# Patient Record
Sex: Female | Born: 1945
Health system: Southern US, Community
[De-identification: ages and names within clinical notes are randomized; demographics above are authoritative.]

## PROBLEM LIST (undated history)

## (undated) DIAGNOSIS — Z87440 Personal history of urinary (tract) infections: Secondary | ICD-10-CM

## (undated) DIAGNOSIS — J0510 Acute epiglottitis without obstruction: Secondary | ICD-10-CM

## (undated) DIAGNOSIS — F419 Anxiety disorder, unspecified: Secondary | ICD-10-CM

## (undated) DIAGNOSIS — J0430 Supraglottitis, unspecified, without obstruction: Secondary | ICD-10-CM

## (undated) DIAGNOSIS — N200 Calculus of kidney: Secondary | ICD-10-CM

## (undated) DIAGNOSIS — T7840XA Allergy, unspecified, initial encounter: Secondary | ICD-10-CM

## (undated) DIAGNOSIS — J181 Lobar pneumonia, unspecified organism: Secondary | ICD-10-CM

## (undated) DIAGNOSIS — F410 Panic disorder [episodic paroxysmal anxiety] without agoraphobia: Secondary | ICD-10-CM

## (undated) HISTORY — DX: Allergy, unspecified, initial encounter: T78.40XA

## (undated) HISTORY — DX: Anxiety disorder, unspecified: F41.9

## (undated) HISTORY — DX: Supraglottitis, unspecified, without obstruction: J04.30

## (undated) HISTORY — DX: Acute epiglottitis without obstruction: J05.10

## (undated) HISTORY — DX: Panic disorder (episodic paroxysmal anxiety): F41.0

## (undated) HISTORY — DX: Lobar pneumonia, unspecified organism: J18.1

## (undated) HISTORY — DX: Personal history of urinary (tract) infections: Z87.440

## (undated) HISTORY — PX: NO PAST SURGERIES: SHX2092

---

## 1986-12-08 HISTORY — PX: TUBAL LIGATION: SHX77

## 1998-05-02 ENCOUNTER — Emergency Department (HOSPITAL_COMMUNITY): Admission: EM | Admit: 1998-05-02 | Discharge: 1998-05-02 | Payer: Self-pay | Admitting: Emergency Medicine

## 1999-12-10 ENCOUNTER — Encounter: Payer: Self-pay | Admitting: Family Medicine

## 1999-12-10 ENCOUNTER — Encounter: Admission: RE | Admit: 1999-12-10 | Discharge: 1999-12-10 | Payer: Self-pay | Admitting: Family Medicine

## 2001-06-14 ENCOUNTER — Encounter: Admission: RE | Admit: 2001-06-14 | Discharge: 2001-09-12 | Payer: Self-pay | Admitting: Family Medicine

## 2001-06-29 ENCOUNTER — Encounter: Admission: RE | Admit: 2001-06-29 | Discharge: 2001-06-29 | Payer: Self-pay | Admitting: Family Medicine

## 2001-06-29 ENCOUNTER — Encounter: Payer: Self-pay | Admitting: Family Medicine

## 2003-06-29 ENCOUNTER — Encounter: Admission: RE | Admit: 2003-06-29 | Discharge: 2003-06-29 | Payer: Self-pay | Admitting: Family Medicine

## 2003-06-29 ENCOUNTER — Encounter: Payer: Self-pay | Admitting: Family Medicine

## 2003-12-04 ENCOUNTER — Ambulatory Visit (HOSPITAL_COMMUNITY): Admission: RE | Admit: 2003-12-04 | Discharge: 2003-12-04 | Payer: Self-pay | Admitting: Gastroenterology

## 2004-07-01 ENCOUNTER — Ambulatory Visit (HOSPITAL_COMMUNITY): Admission: RE | Admit: 2004-07-01 | Discharge: 2004-07-01 | Payer: Self-pay | Admitting: Obstetrics and Gynecology

## 2004-08-07 ENCOUNTER — Emergency Department (HOSPITAL_COMMUNITY): Admission: EM | Admit: 2004-08-07 | Discharge: 2004-08-07 | Payer: Self-pay | Admitting: Emergency Medicine

## 2004-12-27 ENCOUNTER — Ambulatory Visit: Payer: Self-pay | Admitting: Internal Medicine

## 2005-02-28 ENCOUNTER — Ambulatory Visit: Payer: Self-pay | Admitting: Internal Medicine

## 2005-03-07 ENCOUNTER — Ambulatory Visit: Payer: Self-pay | Admitting: Internal Medicine

## 2005-07-09 ENCOUNTER — Ambulatory Visit (HOSPITAL_COMMUNITY): Admission: RE | Admit: 2005-07-09 | Discharge: 2005-07-09 | Payer: Self-pay | Admitting: Internal Medicine

## 2006-07-16 ENCOUNTER — Ambulatory Visit (HOSPITAL_COMMUNITY): Admission: RE | Admit: 2006-07-16 | Discharge: 2006-07-16 | Payer: Self-pay | Admitting: Internal Medicine

## 2007-07-21 ENCOUNTER — Encounter: Admission: RE | Admit: 2007-07-21 | Discharge: 2007-07-21 | Payer: Self-pay | Admitting: Internal Medicine

## 2008-06-27 ENCOUNTER — Other Ambulatory Visit: Admission: RE | Admit: 2008-06-27 | Discharge: 2008-06-27 | Payer: Self-pay | Admitting: Obstetrics and Gynecology

## 2008-07-21 ENCOUNTER — Encounter: Admission: RE | Admit: 2008-07-21 | Discharge: 2008-07-21 | Payer: Self-pay | Admitting: Internal Medicine

## 2009-07-24 ENCOUNTER — Encounter: Admission: RE | Admit: 2009-07-24 | Discharge: 2009-07-24 | Payer: Self-pay | Admitting: Obstetrics and Gynecology

## 2009-07-26 ENCOUNTER — Encounter: Admission: RE | Admit: 2009-07-26 | Discharge: 2009-07-26 | Payer: Self-pay | Admitting: Obstetrics and Gynecology

## 2010-01-31 ENCOUNTER — Encounter: Admission: RE | Admit: 2010-01-31 | Discharge: 2010-01-31 | Payer: Self-pay | Admitting: Obstetrics and Gynecology

## 2010-07-30 ENCOUNTER — Encounter: Admission: RE | Admit: 2010-07-30 | Discharge: 2010-07-30 | Payer: Self-pay | Admitting: Internal Medicine

## 2010-09-25 ENCOUNTER — Encounter: Payer: Self-pay | Admitting: Internal Medicine

## 2010-11-25 ENCOUNTER — Encounter
Admission: RE | Admit: 2010-11-25 | Discharge: 2010-11-25 | Payer: Self-pay | Source: Home / Self Care | Attending: Obstetrics and Gynecology | Admitting: Obstetrics and Gynecology

## 2010-11-25 ENCOUNTER — Encounter: Payer: Self-pay | Admitting: Internal Medicine

## 2010-12-28 ENCOUNTER — Encounter: Payer: Self-pay | Admitting: Urology

## 2010-12-29 ENCOUNTER — Encounter: Payer: Self-pay | Admitting: Internal Medicine

## 2011-01-07 NOTE — Letter (Signed)
Summary: [facility]  [facility]   Imported By: Maryln Gottron 10/08/2010 10:13:36  _____________________________________________________________________  External Attachment:    Type:   Image     Comment:   External Document

## 2011-04-25 NOTE — Op Note (Signed)
NAME:  Heather Lamb, Heather Lamb                         ACCOUNT NO.:  [id number]   MEDICAL RECORD NO.:  [id number]                   PATIENT TYPE:  AMB   LOCATION:  ENDO                                 FACILITY:  [facility]   PHYSICIAN:  Petra Kuba, M.D.                 DATE OF BIRTH:  10/16/1946   DATE OF PROCEDURE:  12/04/2003  DATE OF DISCHARGE:                                 OPERATIVE REPORT   PROCEDURE:  Colonoscopy.   INDICATIONS:  Screening.  Consent was signed after risks, benefits, methods,  and options were thoroughly discussed in the office.   PREMEDICATIONS:  Demerol 70 mg, Versed 7 mg.   DESCRIPTION OF PROCEDURE:  Rectal inspection pertinent for external  hemorrhoids.  Digital exam was negative.  Video pediatric adjustable  colonoscope was inserted and fairly easily advanced around the colon to the  cecum.  No abnormalities seen on insertion.  No position changes or  abdominal pressure was needed.  The cecum was identified by the appendiceal  orifice and the ileocecal valve.  The scope was inserted a short ways into  the terminal ileum which was normal.  Further documentation was obtained.  The scope was slowly withdrawn.  Prep was adequate.  There was some liquid  stool that required washing and suctioning.  On slow withdrawal through the  colon, no polyps, tumors, masses, or diverticula were seen.  As we slowly  withdrew back to the rectum,  anorectal pull-through and retroflexion  confirmed some small hemorrhoids.  The scope was reinserted a short ways up  the left side of the colon.  Air was suctioned and the scope removed.  The  patient tolerated the procedure well.  There were no obvious immediate  complications.   ENDOSCOPIC DIAGNOSES:  1. Internal and external hemorrhoids.  2. Normal exam to the cecum.  Quick look into the terminal ileum.   PLAN:   PLAN:  Yearly rectals and guaiacs per Dr. Kevan Ny.  Would be happy to see him  back p.r.n.  Otherwise repeat screening in  5-10 years.                                               Petra Kuba, M.D.    MEM/MEDQ  D:  12/04/2003  T:  12/04/2003  Job:  [id number]   cc:   Duncan Dull, M.D.  [address]  [place]  [place] [address]  Fax: [phone]

## 2011-04-25 NOTE — Procedures (Signed)
NAME:  Heather Lamb, Heather Lamb NO.:  [id number]   MEDICAL RECORD NO.:  [id number]                   PATIENT TYPE:  EMS   LOCATION:  ED                                   FACILITY:  [facility]   PHYSICIAN:  Edward L. Juanetta Gosling, M.D.             DATE OF BIRTH:  01-20-1946   DATE OF PROCEDURE:  DATE OF DISCHARGE:  08/07/2004                                EKG INTERPRETATION   IMPRESSION:  The rhythm is a supraventricular tachycardia with a rate in the  220's.  The axis is rightward.  ST-T wave abnormalities are seen inferiorly  which could indicate ischemia, and clinical correlation is suggestion.  Abnormal electrocardiogram.      ___________________________________________                                            Oneal Deputy. Juanetta Gosling, M.D.   ELH/MEDQ  D:  08/07/2004  T:  08/07/2004  Job:  [id number]

## 2011-08-12 ENCOUNTER — Other Ambulatory Visit: Payer: Self-pay | Admitting: Obstetrics and Gynecology

## 2011-08-12 DIAGNOSIS — Z1231 Encounter for screening mammogram for malignant neoplasm of breast: Secondary | ICD-10-CM

## 2011-09-24 ENCOUNTER — Ambulatory Visit
Admission: RE | Admit: 2011-09-24 | Discharge: 2011-09-24 | Disposition: A | Discharge status: Home / Self Care | Payer: BC Managed Care – PPO | Source: Ambulatory Visit | Attending: Obstetrics and Gynecology | Admitting: Obstetrics and Gynecology

## 2011-09-24 DIAGNOSIS — Z1231 Encounter for screening mammogram for malignant neoplasm of breast: Secondary | ICD-10-CM

## 2011-10-10 ENCOUNTER — Ambulatory Visit (INDEPENDENT_AMBULATORY_CARE_PROVIDER_SITE_OTHER)
Admission: RE | Admit: 2011-10-10 | Discharge: 2011-10-10 | Disposition: A | Discharge status: Home / Self Care | Payer: BC Managed Care – PPO | Source: Ambulatory Visit | Attending: Internal Medicine | Admitting: Internal Medicine

## 2011-10-10 ENCOUNTER — Ambulatory Visit (INDEPENDENT_AMBULATORY_CARE_PROVIDER_SITE_OTHER): Payer: BC Managed Care – PPO | Admitting: Internal Medicine

## 2011-10-10 ENCOUNTER — Encounter: Payer: Self-pay | Admitting: Internal Medicine

## 2011-10-10 VITALS — BP 120/80 | HR 126 | Temp 99.1°F | Ht 68.0 in | Wt 186.0 lb

## 2011-10-10 DIAGNOSIS — R0989 Other specified symptoms and signs involving the circulatory and respiratory systems: Secondary | ICD-10-CM

## 2011-10-10 DIAGNOSIS — R0600 Dyspnea, unspecified: Secondary | ICD-10-CM | POA: Insufficient documentation

## 2011-10-10 DIAGNOSIS — R509 Fever, unspecified: Secondary | ICD-10-CM | POA: Insufficient documentation

## 2011-10-10 DIAGNOSIS — R0609 Other forms of dyspnea: Secondary | ICD-10-CM

## 2011-10-10 DIAGNOSIS — J189 Pneumonia, unspecified organism: Secondary | ICD-10-CM

## 2011-10-10 LAB — HEPATIC FUNCTION PANEL
ALT: 20 U/L (ref 0–35)
Albumin: 4.2 g/dL (ref 3.5–5.2)
Alkaline Phosphatase: 93 U/L (ref 39–117)
Total Bilirubin: 0.9 mg/dL (ref 0.3–1.2)

## 2011-10-10 LAB — BASIC METABOLIC PANEL
CO2: 26 mEq/L (ref 19–32)
Calcium: 9 mg/dL (ref 8.4–10.5)
Creatinine, Ser: 0.8 mg/dL (ref 0.4–1.2)
GFR: 78.15 mL/min (ref >60.00)
Glucose, Bld: 104 mg/dL — ABNORMAL HIGH (ref 70–99)
Potassium: 4.2 mEq/L (ref 3.5–5.1)

## 2011-10-10 LAB — POCT URINALYSIS DIPSTICK
Blood, UA: NEGATIVE
Nitrite, UA: NEGATIVE
Urobilinogen, UA: 0.2

## 2011-10-10 LAB — CBC WITH DIFFERENTIAL/PLATELET
Eosinophils Relative: 0.1 % (ref 0.0–5.0)
MCHC: 33.8 g/dL (ref 30.0–36.0)
Monocytes Relative: 10.3 % (ref 3.0–12.0)
RBC: 5.49 Mil/uL — ABNORMAL HIGH (ref 3.87–5.11)
RDW: 14.2 % (ref 11.5–14.6)

## 2011-10-10 MED ORDER — LEVOFLOXACIN 750 MG PO TABS: 750.0000 mg | ORAL_TABLET | Freq: Every day | ORAL | Status: AC

## 2011-10-10 NOTE — Progress Notes (Signed)
Subjective:    Patient ID: Heather Lamb, female    DOB: 19-Feb-1946, 65 y.o.   MRN: [id number]  HPI  Patient comes in today for SDA  Acute urgent  Work in for acute problem evaluation with her husband .  Sudden onset of  Illness3 days. with fever cough and sever frontal headache.  The day before felling achy but had been very well She has not been in our practice since  2006 because she has been well and sees her gyne on a reg basis  And has had no major illness or injury.  .     Cough onset same time. bad headache and NOt better with  Hydrocodone ,aleve, advil : cough  Is dry  And someetimes deep and severe head  pain increase with cough; ears hurt.  No pleurisy cp but chest feels heavy.  "Legs are jello. " Worse today than yesterday  . Feels short of breath and very weak. Po fluids ok  but "not that much" . urine concentrated.Decrease appettite Never gets flu shot or flu.  No exposures although is outdoors a lot .  Sees GYNE regularly.  Review of Systems NO uti sx .  Rash bites bleeding  Was well before  Walked 4 miles as regular before then.  ROS:  GEN/ HEENTNos vision problems hearing changes, CV/ PULM; No , syncope,edema  change in exercise tolerance. Except as above GI /GU: No adominal pain, vomiting, change in bowel habits. No blood in the stool. No significant GU symptoms. SKIN/HEME: ,no acute skin rashes suspicious lesions or bleeding. No lymphadenopathy, nodules, masses.  NEURO/ PSYCH:  No neurologic signs such as weakness numbness No depression anxiety. IMM/ Allergy: No unusual infections.  Allergy .  No   REST of 12 system review negative or as per hpi   Past Medical History  Diagnosis Date  . History of UTI    Past Surgical History  Procedure Date  . No past surgeries     reports that she has never smoked. She does not have any smokeless tobacco history on file. She reports that she does not drink alcohol or use illicit drugs. family history includes Arthritis in her  father; Heart disease in her father; Hyperlipidemia in an unspecified family member; Hypertension in an unspecified family member; and Stroke in an unspecified family member. No Known Allergies     Objective:   Physical Exam  Physical Exam: Vital signs reviewed BJY:NWGN is a well-developed well-nourished ill appearing  cooperative  white female who appears her stated age  Ill  But non toxic  Quiet respirations HEENT: normocephalic  traumatic , Eyes: PERRL EOM's full, conjunctiva clear, Nares: paten,t no deformity discharge or tenderness., Ears: no deformity EAC's clear TMs with normal landmarks. Mouth: clear OP, no lesions, edema.  Moist mucous membranes. Dentition in adequate repair. Early fever blister lower lip NECK: supple without masses, thyromegaly or bruits. CHEST/PULM:  Clear to auscultation and percussion breath sounds equal no wheeze , rales or rhonchi. No chest wall deformities or tenderness. CV: PMI is nondisplaced, S1 S2 no gallops, murmurs, rubs. Peripheral pulses are full without delay. ABDOMEN: Bowel sounds normal nontender  No guard or rebound, no hepato splenomegal no CVA tenderness.  Extremtities:  No clubbing cyanosis or edema, no acute joint swelling or redness no focal atrophy NEURO:  Oriented x3, cranial nerves 3-12 appear to be intact, no obvious focal weakness,gait within normal limits no abnormal reflexes or asymmetrical SKIN: No acute rashes normal turgor, color,  no bruising or petechiae. Some dryness  No rash  PSYCH: Oriented, good eye contact, no obvious depression anxiety, cognition and judgment appear normal. Sick and wearing mask. Prefers to lay down     Assessment & Plan:  Acute febrile illness with cough  HA and SOB   No underlying comorbid  illness hx  R/o pneumonia   Flu etc.  Encourage hydration    Will call with labs   Cxray shows consolidated RUL  Needs close  fu. Will rx with Levaquin and close fu.  See message.  Lab Results  Component Value  Date   WBC 10.5 10/10/2011   HGB 15.6* 10/10/2011   HCT 46.0 10/10/2011   PLT 205.0 10/10/2011   GLUCOSE 104* 10/10/2011   ALT 20 10/10/2011   AST 15 10/10/2011   NA 138 10/10/2011   K 4.2 10/10/2011   CL 102 10/10/2011   CREATININE 0.8 10/10/2011   BUN 13 10/10/2011   CO2 26 10/10/2011

## 2011-10-10 NOTE — Progress Notes (Signed)
Pt's husband aware of results

## 2011-10-10 NOTE — Patient Instructions (Signed)
This acts like either flu  typeillness wichis viral  But concern about pneumonia so get chest x ray and will notify you of  Labs and plan

## 2011-10-11 DIAGNOSIS — J189 Pneumonia, unspecified organism: Secondary | ICD-10-CM | POA: Insufficient documentation

## 2011-10-11 HISTORY — DX: Pneumonia, unspecified organism: J18.9

## 2011-11-18 ENCOUNTER — Other Ambulatory Visit: Payer: Self-pay | Admitting: Internal Medicine

## 2011-11-21 ENCOUNTER — Ambulatory Visit (INDEPENDENT_AMBULATORY_CARE_PROVIDER_SITE_OTHER)
Admission: RE | Admit: 2011-11-21 | Discharge: 2011-11-21 | Disposition: A | Discharge status: Home / Self Care | Payer: BC Managed Care – PPO | Source: Ambulatory Visit | Attending: Internal Medicine | Admitting: Internal Medicine

## 2011-11-21 DIAGNOSIS — J189 Pneumonia, unspecified organism: Secondary | ICD-10-CM

## 2011-12-01 NOTE — Progress Notes (Signed)
Quick Note:  Pt aware of results. ______ 

## 2012-07-26 ENCOUNTER — Ambulatory Visit (INDEPENDENT_AMBULATORY_CARE_PROVIDER_SITE_OTHER): Payer: BC Managed Care – PPO | Admitting: Internal Medicine

## 2012-07-26 ENCOUNTER — Encounter: Payer: Self-pay | Admitting: Internal Medicine

## 2012-07-26 VITALS — BP 134/84 | Temp 98.7°F | Wt 190.0 lb

## 2012-07-26 DIAGNOSIS — R42 Dizziness and giddiness: Secondary | ICD-10-CM

## 2012-07-26 MED ORDER — MOMETASONE FUROATE 50 MCG/ACT NA SUSP: 2.0000 | Freq: Every day | NASAL | Status: DC

## 2012-07-26 MED ORDER — DIAZEPAM 2 MG PO TABS: 2.0000 mg | ORAL_TABLET | Freq: Three times a day (TID) | ORAL | Status: AC | PRN

## 2012-07-26 NOTE — Patient Instructions (Addendum)
Please call our office if your symptoms do not improve or gets worse. Follow up with Dr. Fabian Sharp within 2 weeks

## 2012-07-26 NOTE — Assessment & Plan Note (Signed)
66 year old white female with positional dizziness and left ear pressure. I suspect her symptoms may be secondary to labyrinthitis. Consider component of benign positional vertigo. Use Nasonex and diazepam as directed.  Patient advised to call office if symptoms persist or worsen.

## 2012-07-26 NOTE — Progress Notes (Signed)
  Subjective:    Patient ID: Heather Lamb, female    DOB: [birth year], 66 y.o.   MRN: [id number]  HPI  66 year old white female complains of left earache and dizziness for 4 days. Patient reports over the last several days that she feels unsteady especially after sudden movements. She denies hearing loss. She started over-the-counter Sudafed and notes some improvement. Patient also reports increasing postnasal drip after starting Sudafed.  Review of Systems  Negative for headache,  Negative for fever or chills  Past Medical History  Diagnosis Date  . History of UTI     History   Social History  . Marital Status: Married    Spouse Name: N/A    Number of Children: N/A  . Years of Education: N/A   Occupational History  . Not on file.   Social History Main Topics  . Smoking status: Never Smoker   . Smokeless tobacco: Not on file  . Alcohol Use: No  . Drug Use: No  . Sexually Active: Not on file   Other Topics Concern  . 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     Past Surgical History  Procedure Date  . No past surgeries     Family History  Problem Relation Age of Onset  . Arthritis Father   . Heart disease Father   . Hypertension      parent  . Stroke    . Hyperlipidemia      No Known Allergies  Current Outpatient Prescriptions on File Prior to Visit  Medication Sig Dispense Refill  . calcium carbonate 1250 MG capsule Take 1,250 mg by mouth 2 (two) times daily with a meal.        . cetirizine (ZYRTEC) 10 MG tablet Take 10 mg by mouth daily.      . cholecalciferol (VITAMIN D) 1000 UNITS tablet Take 1,000 Units by mouth daily.          BP 134/84  Temp 98.7 F (37.1 C) (Oral)  Wt 190 lb (86.183 kg)       Objective:   Physical Exam  Constitutional: She is oriented to person, place, and time. She appears well-developed and well-nourished. No distress.  HENT:  Head:  Normocephalic and atraumatic.       Left tympanic membrane slightly retracted Hearing is grossly normal  Eyes: EOM are normal.       No nystagmus  Neck: Neck supple.       No neck tenderness  Cardiovascular: Normal rate, regular rhythm and normal heart sounds.   No murmur heard. Pulmonary/Chest: Effort normal and breath sounds normal. She has no wheezes.  Lymphadenopathy:    She has no cervical adenopathy.  Neurological: She is alert and oriented to person, place, and time. No cranial nerve deficit.          Assessment & Plan:

## 2012-08-11 ENCOUNTER — Ambulatory Visit (INDEPENDENT_AMBULATORY_CARE_PROVIDER_SITE_OTHER): Payer: BC Managed Care – PPO | Admitting: Internal Medicine

## 2012-08-11 ENCOUNTER — Encounter: Payer: Self-pay | Admitting: Internal Medicine

## 2012-08-11 VITALS — BP 160/94 | HR 86 | Temp 97.8°F | Wt 194.0 lb

## 2012-08-11 DIAGNOSIS — Z8669 Personal history of other diseases of the nervous system and sense organs: Secondary | ICD-10-CM

## 2012-08-11 DIAGNOSIS — Z87898 Personal history of other specified conditions: Secondary | ICD-10-CM

## 2012-08-11 DIAGNOSIS — H9312 Tinnitus, left ear: Secondary | ICD-10-CM

## 2012-08-11 DIAGNOSIS — R030 Elevated blood-pressure reading, without diagnosis of hypertension: Secondary | ICD-10-CM

## 2012-08-11 DIAGNOSIS — H6990 Unspecified Eustachian tube disorder, unspecified ear: Secondary | ICD-10-CM | POA: Insufficient documentation

## 2012-08-11 DIAGNOSIS — H9319 Tinnitus, unspecified ear: Secondary | ICD-10-CM

## 2012-08-11 DIAGNOSIS — H6980 Other specified disorders of Eustachian tube, unspecified ear: Secondary | ICD-10-CM

## 2012-08-11 DIAGNOSIS — J329 Chronic sinusitis, unspecified: Secondary | ICD-10-CM

## 2012-08-11 MED ORDER — PREDNISONE 20 MG PO TABS: ORAL_TABLET | ORAL | Status: AC

## 2012-08-11 MED ORDER — AMOXICILLIN-POT CLAVULANATE 875-125 MG PO TABS: 1.0000 | ORAL_TABLET | Freq: Two times a day (BID) | ORAL | Status: AC

## 2012-08-11 NOTE — Progress Notes (Signed)
  Subjective:    Patient ID: Heather Lamb, female    DOB: 01-08-1946, 66 y.o.   MRN: [id number]  HPI Patient comes in for followup having been seen a couple weeks ago for acute vertigo felt from labyrinthitis. She had some symptoms in her left ear. Since that time she took Valium and the Nasonex and it helped greatly. She is now only taking the Nasonex. nasonex and diazepam if needed and then dizziness got better .   No diazepam he still has ringing feeling in her ears in the morning that goes away after she gets up in the drainage gets better Ringing       Left.   Ear  Ringing  Every am.   Has congestion  And blows nose and after ir drains feels better. She feels like she has pressure in her face more on the left. No nausea vomiting or falling or dizziness.  Headaches  it had some on the left side where there was a knot at the back of her neck that is now better   Tends to get some sinus problems but no diagnosed allergy. Watery eyes.   When reading.    He tends to have some congestion in the seasons. Review of Systems Negative for fever chest pain shortness of breath blood pressure is usually normal although can raise up when she is at the doctor's office. No ha vision change SOB   Past history family history social history reviewed in the electronic medical record.     Objective:   Physical Exam BP 160/94  Pulse 86  Temp 97.8 F (36.6 C) (Oral)  Wt 194 lb (87.998 kg)  SpO2 97% Repeat BP 150/88 right  WDWN in nad congested . eues clear  eoms nl ears tmx intact nl lm  hearing appears good on the right appears to be decreased on the left to finger rub. Nares mucoid dc left face mildly tender on left .   `OP clear without lesion tongue midline Neck supple palpable a.c. nodes nontender mobile  Chest clear to auscultation cardiac S1-S2 regular rhythm. Negative CCE Neurologic cranial nerves III through XII except for hearing on the left appear to be intact. No focal weakness gait within  normal limits heel-to-shin normal negative Romberg and no tremor     Assessment & Plan:   Vertigo improved Continued ongoing sinus congestion with left-sided symptoms with a.m. ringing in the ear and left-sided symptoms.  Suspect a sinusitis  with eustachian tube dysfunction that is trying to resolve; because she has persistent symptoms and is congested we'll add antibiotic some 5 days of prednisone have her continue her Nasonex that I think has helped her a good bit and if does not get back to baseline with this treatment consider further workup ENTt consult versus sinus imaging.  Elevated blood pressure reading  coming down probably white coat effect she should check her readings at home have machine. Contact us if elevated. For further evaluation

## 2012-08-11 NOTE — Patient Instructions (Signed)
Your exam is normal except for your sinus congestion. We are going to treat you for sinusitis that has not resolved with prednisone 5 days and antibiotics.. Continue the Nasonex even after you are better to suppress chronic congestion. I suspect that you have eustachian tube dysfunction on the left that is causing some of your symptoms.  neurologic exam is good.  If not getting back to normal after the above treatments and a week or 2 contact us would consider having ENT. evaluate your situation.  Check blood pressure readings at home it is elevated today at the office below 140/90 is the goal.  If continued elevated contact us.

## 2012-08-31 ENCOUNTER — Other Ambulatory Visit: Payer: Self-pay | Admitting: Obstetrics and Gynecology

## 2012-08-31 DIAGNOSIS — Z1231 Encounter for screening mammogram for malignant neoplasm of breast: Secondary | ICD-10-CM

## 2012-10-06 ENCOUNTER — Ambulatory Visit
Admission: RE | Admit: 2012-10-06 | Discharge: 2012-10-06 | Disposition: A | Discharge status: Home / Self Care | Payer: BC Managed Care – PPO | Source: Ambulatory Visit | Attending: Obstetrics and Gynecology | Admitting: Obstetrics and Gynecology

## 2012-10-06 ENCOUNTER — Ambulatory Visit (INDEPENDENT_AMBULATORY_CARE_PROVIDER_SITE_OTHER): Payer: BC Managed Care – PPO | Admitting: Family Medicine

## 2012-10-06 DIAGNOSIS — Z23 Encounter for immunization: Secondary | ICD-10-CM

## 2012-10-06 DIAGNOSIS — Z1231 Encounter for screening mammogram for malignant neoplasm of breast: Secondary | ICD-10-CM

## 2012-10-06 DIAGNOSIS — Z2911 Encounter for prophylactic immunotherapy for respiratory syncytial virus (RSV): Secondary | ICD-10-CM

## 2012-11-27 ENCOUNTER — Encounter: Payer: Self-pay | Admitting: Family Medicine

## 2012-11-27 ENCOUNTER — Ambulatory Visit (INDEPENDENT_AMBULATORY_CARE_PROVIDER_SITE_OTHER): Payer: BC Managed Care – PPO | Admitting: Family Medicine

## 2012-11-27 VITALS — BP 118/82 | HR 89 | Temp 98.2°F | Ht 68.0 in | Wt 191.0 lb

## 2012-11-27 DIAGNOSIS — J329 Chronic sinusitis, unspecified: Secondary | ICD-10-CM

## 2012-11-27 DIAGNOSIS — R030 Elevated blood-pressure reading, without diagnosis of hypertension: Secondary | ICD-10-CM

## 2012-11-27 MED ORDER — GUAIFENESIN ER 600 MG PO TB12: 600.0000 mg | ORAL_TABLET | Freq: Two times a day (BID) | ORAL | Status: DC

## 2012-11-27 MED ORDER — AMOXICILLIN-POT CLAVULANATE 875-125 MG PO TABS: 1.0000 | ORAL_TABLET | Freq: Two times a day (BID) | ORAL | Status: DC

## 2012-11-27 MED ORDER — HYDROCOD POLST-CHLORPHEN POLST 10-8 MG/5ML PO LQCR: 5.0000 mL | Freq: Every evening | ORAL | Status: DC | PRN

## 2012-11-27 NOTE — Assessment & Plan Note (Signed)
Augmentin, Mucinex, increase rest and fluid, cough is keeping her up so given Tussinex prn

## 2012-11-27 NOTE — Patient Instructions (Addendum)
Probiotics daily, Digestive Advantage, Align, Phillip's colon while on antibiotics  Sinusitis Sinusitis is redness, soreness, and swelling (inflammation) of the paranasal sinuses. Paranasal sinuses are air pockets within the bones of your face (beneath the eyes, the middle of the forehead, or above the eyes). In healthy paranasal sinuses, mucus is able to drain out, and air is able to circulate through them by way of your nose. However, when your paranasal sinuses are inflamed, mucus and air can become trapped. This can allow bacteria and other germs to grow and cause infection. Sinusitis can develop quickly and last only a short time (acute) or continue over a long period (chronic). Sinusitis that lasts for more than 12 weeks is considered chronic.  CAUSES  Causes of sinusitis include:  Allergies.  Structural abnormalities, such as displacement of the cartilage that separates your nostrils (deviated septum), which can decrease the air flow through your nose and sinuses and affect sinus drainage.  Functional abnormalities, such as when the small hairs (cilia) that line your sinuses and help remove mucus do not work properly or are not present. SYMPTOMS  Symptoms of acute and chronic sinusitis are the same. The primary symptoms are pain and pressure around the affected sinuses. Other symptoms include:  Upper toothache.  Earache.  Headache.  Bad breath.  Decreased sense of smell and taste.  A cough, which worsens when you are lying flat.  Fatigue.  Fever.  Thick drainage from your nose, which often is green and may contain pus (purulent).  Swelling and warmth over the affected sinuses. DIAGNOSIS  Your caregiver will perform a physical exam. During the exam, your caregiver may:  Look in your nose for signs of abnormal growths in your nostrils (nasal polyps).  Tap over the affected sinus to check for signs of infection.  View the inside of your sinuses (endoscopy) with a special  imaging device with a light attached (endoscope), which is inserted into your sinuses. If your caregiver suspects that you have chronic sinusitis, one or more of the following tests may be recommended:  Allergy tests.  Nasal culture A sample of mucus is taken from your nose and sent to a lab and screened for bacteria.  Nasal cytology A sample of mucus is taken from your nose and examined by your caregiver to determine if your sinusitis is related to an allergy. TREATMENT  Most cases of acute sinusitis are related to a viral infection and will resolve on their own within 10 days. Sometimes medicines are prescribed to help relieve symptoms (pain medicine, decongestants, nasal steroid sprays, or saline sprays).  However, for sinusitis related to a bacterial infection, your caregiver will prescribe antibiotic medicines. These are medicines that will help kill the bacteria causing the infection.  Rarely, sinusitis is caused by a fungal infection. In theses cases, your caregiver will prescribe antifungal medicine. For some cases of chronic sinusitis, surgery is needed. Generally, these are cases in which sinusitis recurs more than 3 times per year, despite other treatments. HOME CARE INSTRUCTIONS   Drink plenty of water. Water helps thin the mucus so your sinuses can drain more easily.  Use a humidifier.  Inhale steam 3 to 4 times a day (for example, sit in the bathroom with the shower running).  Apply a warm, moist washcloth to your face 3 to 4 times a day, or as directed by your caregiver.  Use saline nasal sprays to help moisten and clean your sinuses.  Take over-the-counter or prescription medicines for pain, discomfort,  or fever only as directed by your caregiver. SEEK IMMEDIATE MEDICAL CARE IF:  You have increasing pain or severe headaches.  You have nausea, vomiting, or drowsiness.  You have swelling around your face.  You have vision problems.  You have a stiff neck.  You have  difficulty breathing. MAKE SURE YOU:   Understand these instructions.  Will watch your condition.  Will get help right away if you are not doing well or get worse. Document Released: 11/24/2005 Document Revised: 02/16/2012 Document Reviewed: 12/09/2011 [facility] Patient Information 2013 [place], [place].

## 2012-11-27 NOTE — Assessment & Plan Note (Signed)
Well controlled despite acute illness 

## 2012-11-27 NOTE — Progress Notes (Signed)
Patient ID: Heather Lamb, female   DOB: 1946-08-09, 66 y.o.   MRN: [id number] Heather Lamb [id number] [birth year] 11/27/2012      Progress Note-Follow Up  Subjective  Chief Complaint  Chief Complaint  Patient presents with  . Otalgia    sinus drainage, fever cough x 2days    HPI  Patient is a 66 year old female who is in today complaining of respiratory symptoms. Symptoms for roughly 4 days now. Complaining of headache, fevers, chills , postnasal drip and nasal congestion. No significant rhinorrhea or myalgias. Is struggling with fatigue and her chest congestion is noting she is most worried about. At this point she has trouble sleeping when she lies down the cough is worse. She tried Tylenol for temperature which does bring it down but then it recurrs  Past Medical History  Diagnosis Date  . History of UTI     Past Surgical History  Procedure Date  . No past surgeries     Family History  Problem Relation Age of Onset  . Arthritis Father   . Heart disease Father   . Hypertension      parent  . Stroke    . Hyperlipidemia      History   Social History  . Marital Status: Married    Spouse Name: N/A    Number of Children: N/A  . Years of Education: N/A   Occupational History  . Not on file.   Social History Main Topics  . Smoking status: Never Smoker   . Smokeless tobacco: Not on file  . Alcohol Use: No  . Drug Use: No  . Sexually Active: Not on file   Other Topics Concern  . 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     Current Outpatient Prescriptions on File Prior to Visit  Medication Sig Dispense Refill  . calcium carbonate 1250 MG capsule Take 1,250 mg by mouth 2 (two) times daily with a meal.        . cetirizine (ZYRTEC) 10 MG tablet Take 10 mg by mouth daily.      . cholecalciferol (VITAMIN D) 1000 UNITS tablet Take 1,000 Units by mouth daily.        . mometasone  (NASONEX) 50 MCG/ACT nasal spray Place 2 sprays into the nose daily.  17 g  2    No Known Allergies  Review of Systems  Review of Systems  Constitutional: Positive for fever and malaise/fatigue.  HENT: Positive for ear pain, congestion and sore throat.   Eyes: Negative for discharge.  Respiratory: Positive for cough and sputum production. Negative for shortness of breath.   Cardiovascular: Negative for chest pain, palpitations and leg swelling.  Gastrointestinal: Negative for nausea, abdominal pain and diarrhea.  Genitourinary: Negative for dysuria.  Musculoskeletal: Negative for falls.  Skin: Negative for rash.  Neurological: Positive for headaches. Negative for loss of consciousness.  Endo/Heme/Allergies: Negative for polydipsia.  Psychiatric/Behavioral: Negative for depression and suicidal ideas. The patient is not nervous/anxious and does not have insomnia.     Objective  BP 118/82  Pulse 89  Temp 98.2 F (36.8 C) (Oral)  Ht 5\' 8"  (1.727 m)  Wt 191 lb (86.637 kg)  BMI 29.04 kg/m2  SpO2 95%  Physical Exam  Physical Exam  Constitutional: She is oriented to person, place, and time and well-developed, well-nourished, and in no distress. No distress.  HENT:  Head: Normocephalic and atraumatic.       tms dull and retracted, nasal mucosa boggy and erythematous  Eyes: Conjunctivae normal are normal.  Neck: Neck supple. No thyromegaly present.  Cardiovascular: Normal rate, regular rhythm and normal heart sounds.   No murmur heard. Pulmonary/Chest: Effort normal and breath sounds normal. She has no wheezes.  Abdominal: She exhibits no distension and no mass.  Musculoskeletal: She exhibits no edema.  Lymphadenopathy:    She has no cervical adenopathy.  Neurological: She is alert and oriented to person, place, and time.  Skin: Skin is warm and dry. No rash noted. She is not diaphoretic.  Psychiatric: Memory, affect and judgment normal.    No results found for this  basename: TSH   Lab Results  Component Value Date   WBC 10.5 10/10/2011   HGB 15.6* 10/10/2011   HCT 46.0 10/10/2011   MCV 83.9 10/10/2011   PLT 205.0 10/10/2011   Lab Results  Component Value Date   CREATININE 0.8 10/10/2011   BUN 13 10/10/2011   NA 138 10/10/2011   K 4.2 10/10/2011   CL 102 10/10/2011   CO2 26 10/10/2011   Lab Results  Component Value Date   ALT 20 10/10/2011   AST 15 10/10/2011   ALKPHOS 93 10/10/2011   BILITOT 0.9 10/10/2011     Assessment & Plan  Sinusitis Augmentin, Mucinex, increase rest and fluid, cough is keeping her up so given Tussinex prn  Elevated blood pressure reading Well controlled despite acute illness

## 2012-11-29 ENCOUNTER — Telehealth: Payer: Self-pay | Admitting: Internal Medicine

## 2012-11-29 NOTE — Telephone Encounter (Signed)
[facility] Triage Call Report Triage Record Num: [id number] Operator: Chevis Pretty Patient Name: Heather Lamb Call Date & Time: 11/27/2012 6:14:05AM Patient Phone: [phone] PCP: Neta Mends. Panosh Patient Gender: Female PCP Fax : [phone] Patient DOB: 05-Jan-1946 [facility] Name[facility]  Reason for Call: Caller: Raivyn/Patient; PCP: Berniece Andreas (Family Practice); CB#: [phone]; Call regarding Cough/Congestion; onset 11/24/12, but c/o onset of earache 11/27/12 bilaterally. Temp 100.5 O. Per URI protocol, emergent symptoms denied; advised appt within 24 hours. Appt scheduled 11/27/12 1115 with Dr. Abner Greenspan at [place] office.  Protocol(s) Used: Upper Respiratory Infection (URI) Recommended Outcome per Protocol: See Provider within 24 hours Reason for Outcome: Mild to moderate headache for more than 24 hours unrelieved with nonprescription medications

## 2012-12-07 ENCOUNTER — Telehealth: Payer: Self-pay | Admitting: Internal Medicine

## 2012-12-07 NOTE — Telephone Encounter (Signed)
Pt will call after New Year to sched appt if no better. Encounter closed.

## 2012-12-07 NOTE — Telephone Encounter (Signed)
Patient Information:  Caller Name: Louellen  Phone: [phone]  Patient: Heather Lamb, Heather Lamb  Gender: Female  DOB: 10-Oct-1946  Age: 66 Years  PCP: Berniece Andreas (Family Practice)  Office Follow Up:  Does the office need to follow up with this patient?: No  Instructions For The Office: N/A  RN Note:  Completed Augmentin for sinus infection and used up Tussinex.  Drinking lots of water, hot tea and honey.  Asking what else can be done for cough. Sinus headaches and fevers resolved. Intermittent dry cough, worse at night.  Symptoms  Reason For Call & Symptoms: Lingering intermittent cough  Reviewed Health History In EMR: Yes  Reviewed Medications In EMR: Yes  Reviewed Allergies In EMR: Yes  Reviewed Surgeries / Procedures: Yes  Date of Onset of Symptoms: 11/27/2012  Treatments Tried: mucinex, hot fluids, water, cough drops, elevated head of bed  Treatments Tried Worked: No  Guideline(s) Used:  Cough  Disposition Per Guideline:   See Within 3 Days in Office  Reason For Disposition Reached:   Cough has been present for > 10 days  Advice Given:  Reassurance  Coughing is the way that our lungs remove irritants and mucus. It helps protect our lungs from getting pneumonia.  You can get a dry hacking cough after a chest cold. Sometimes this type of cough can last 1-3 weeks, and be worse at night.  Cough Medicines:  OTC Cough Syrups: The most common cough suppressant in OTC cough medications is dextromethorphan. Often the letters "DM" appear in the name.  Home Remedy - Hard Candy: Hard candy works just as well as medicine-flavored OTC cough drops. Diabetics should use sugar-free candy.  Home Remedy - Honey: This old home remedy has been shown to help decrease coughing at night. The adult dosage is 2 teaspoons (10 ml) at bedtime. Honey should not be given to infants under one year of age.  Coughing Spasms:  Drink warm fluids. Inhale warm mist (Reason: both relax the airway and loosen  up the phlegm).  Prevent Dehydration:  Drink adequate liquids.  Expected Course:   The expected course depends on what is causing the cough.  Viral bronchitis (chest cold) causes a cough that lasts 1 to 3 weeks. Sometimes you may cough up lots of phlegm (sputum, mucus). The mucus can normally be white, gray, yellow, or green.  RN Overrode Recommendation:  Follow Up With Office Later  Will try care recommendations; prefers to schedule appointment with office after holiday, if cough persists

## 2013-03-28 ENCOUNTER — Ambulatory Visit: Payer: Self-pay | Admitting: Nurse Practitioner

## 2013-06-06 ENCOUNTER — Encounter: Payer: Self-pay | Admitting: Internal Medicine

## 2013-06-06 ENCOUNTER — Ambulatory Visit (INDEPENDENT_AMBULATORY_CARE_PROVIDER_SITE_OTHER): Payer: BC Managed Care – PPO | Admitting: Internal Medicine

## 2013-06-06 VITALS — BP 144/90 | HR 79 | Temp 98.0°F | Wt 193.0 lb

## 2013-06-06 DIAGNOSIS — R590 Localized enlarged lymph nodes: Secondary | ICD-10-CM

## 2013-06-06 DIAGNOSIS — H9202 Otalgia, left ear: Secondary | ICD-10-CM

## 2013-06-06 DIAGNOSIS — R599 Enlarged lymph nodes, unspecified: Secondary | ICD-10-CM

## 2013-06-06 DIAGNOSIS — J329 Chronic sinusitis, unspecified: Secondary | ICD-10-CM

## 2013-06-06 DIAGNOSIS — H9209 Otalgia, unspecified ear: Secondary | ICD-10-CM

## 2013-06-06 MED ORDER — AMOXICILLIN-POT CLAVULANATE 875-125 MG PO TABS: 1.0000 | ORAL_TABLET | Freq: Two times a day (BID) | ORAL | Status: DC

## 2013-06-06 NOTE — Patient Instructions (Signed)
This acts like a secondary infection    Poss bacterial sinusitis  infection . Your ear drum is normal but I think the pain is radiating from the large swollen gland in the left neck that  Could be from bacterial sinus or throat infection .

## 2013-06-06 NOTE — Progress Notes (Signed)
Chief Complaint  Patient presents with  . Fever    Has tried Multi Sx Mucinex.  Does not help.  . Chills  . Generalized Body Aches  . Otalgia  . Sore Throat  . Cough  . Post nasal drip  . Headache    HPI: Patient comes in today for SDA for  new problem evaluation. Onset  7 days ago  Grand child was sick  "And hes back in day care " And now right herself treatments it has settled in sinuses and ears.   But still feeling bad hard to get out of bed doesn't usually get sick  No sob like pneu . Onset with fever then cough and congestion got worse. This weekend  . Taking  OtC.  No vomiting.   Bad ha and left ear  Pain recently  Left. Compresses. Hurts to swallow also in left neck hurts.  ROS: See pertinent positives and negatives per HPI. Hx of left tooth pain in past  History of left neck ear pain when she gets that has actually had the dentist check x-rays of her upper molars and said there is no disease at that time. Her pain gets better in between negative history of strep last history of sinus infection treatment was December of last year  Past Medical History  Diagnosis Date  . History of UTI     Family History  Problem Relation Age of Onset  . Arthritis Father   . Heart disease Father   . Hypertension      parent  . Stroke    . Hyperlipidemia      History   Social History  . Marital Status: Married    Spouse Name: N/A    Number of Children: N/A  . Years of Education: N/A   Social History Main Topics  . Smoking status: Never Smoker   . Smokeless tobacco: None  . Alcohol Use: No  . Drug Use: No  . Sexually Active: None   Other Topics Concern  . None   Social History Narrative   HHof 2    Retired Investment banker, operational degree   g4 p4    Neg tad husband smokes but no ets.     Outpatient Encounter Prescriptions as of 06/06/2013  Medication Sig Dispense Refill  . calcium carbonate 1250 MG capsule Take 1,250 mg by mouth 2 (two) times daily with a meal.         . cetirizine (ZYRTEC) 10 MG tablet Take 10 mg by mouth daily.      . cholecalciferol (VITAMIN D) 1000 UNITS tablet Take 1,000 Units by mouth daily.        Marland Kitchen guaiFENesin (MUCINEX) 600 MG 12 hr tablet Take 1 tablet (600 mg total) by mouth 2 (two) times daily. X 10 days      . amoxicillin-clavulanate (AUGMENTIN) 875-125 MG per tablet Take 1 tablet by mouth every 12 (twelve) hours.  20 tablet  0  . [DISCONTINUED] amoxicillin-clavulanate (AUGMENTIN) 875-125 MG per tablet Take 1 tablet by mouth 2 (two) times daily.  20 tablet  0  . [DISCONTINUED] chlorpheniramine-HYDROcodone (TUSSIONEX PENNKINETIC ER) 10-8 MG/5ML LQCR Take 5 mLs by mouth at bedtime as needed.  140 mL  0  . [DISCONTINUED] mometasone (NASONEX) 50 MCG/ACT nasal spray Place 2 sprays into the nose daily.  17 g  2   No facility-administered encounter medications on file as of 06/06/2013.    EXAM:  BP 144/90  Pulse 79  Temp(Src) 98 F (36.7 C) (Oral)  Wt 193 lb (87.544 kg)  BMI 29.35 kg/m2  SpO2 99%  Body mass index is 29.35 kg/(m^2).  GENERAL: vitals reviewed and listed above, alert, oriented, appears well hydrated and in no acute distress she is quite congested looks a bit tired nontoxic  HEENT: atraumatic, conjunctiva  clear, no obvious abnormalities on inspection of external nose and ears TMs intact right a translucent EACs are patent face nontender but yellow opaque discharge left greater than right OP : Red left tonsil +1 question early exudate. NECK: Supple large left a.c. node that is tender +2 shoddy PC nodes minor tenderness in the right a.c. node.  LUNGS: clear to auscultation bilaterally, no wheezes, rales or rhonchi, good air movement CV: HRRR, no clubbing cyanosis or  peripheral edema nl cap refill  Abdomen soft without organomegaly guarding or rebound MS: moves all extremities without noticeable focal  abnormality Gait intact cognition normal  ASSESSMENT AND PLAN:  Discussed the following assessment and  plan:  Anterior cervical adenopathy - Asymmetric left possible from secondary sinusitis  Sinusitis - Secondary infection primary viral upper respiratory infection  Otalgia of left ear - Probably from reactive adenopathy  -Patient advised to return or notify health care team  if symptoms worsen or persist or new concerns arise.  Patient Instructions  This acts like a secondary infection    Poss bacterial sinusitis  infection . Your ear drum is normal but I think the pain is radiating from the large swollen gland in the left neck that  Could be from bacterial sinus or throat infection .     Neta Mends. Brystal Kildow M.D.

## 2013-08-31 ENCOUNTER — Other Ambulatory Visit: Payer: Self-pay

## 2013-08-31 DIAGNOSIS — Z1231 Encounter for screening mammogram for malignant neoplasm of breast: Secondary | ICD-10-CM

## 2013-10-12 ENCOUNTER — Ambulatory Visit
Admission: RE | Admit: 2013-10-12 | Discharge: 2013-10-12 | Disposition: A | Discharge status: Home / Self Care | Payer: BC Managed Care – PPO | Source: Ambulatory Visit

## 2013-10-12 DIAGNOSIS — Z1231 Encounter for screening mammogram for malignant neoplasm of breast: Secondary | ICD-10-CM

## 2013-10-13 ENCOUNTER — Other Ambulatory Visit: Payer: Self-pay

## 2014-02-28 ENCOUNTER — Encounter: Payer: Self-pay | Admitting: Internal Medicine

## 2014-02-28 ENCOUNTER — Ambulatory Visit (INDEPENDENT_AMBULATORY_CARE_PROVIDER_SITE_OTHER): Payer: Medicare Other | Admitting: Internal Medicine

## 2014-02-28 VITALS — BP 142/80 | HR 77 | Temp 98.2°F | Ht 66.5 in | Wt 188.0 lb

## 2014-02-28 DIAGNOSIS — H9319 Tinnitus, unspecified ear: Secondary | ICD-10-CM

## 2014-02-28 DIAGNOSIS — H9312 Tinnitus, left ear: Secondary | ICD-10-CM

## 2014-02-28 DIAGNOSIS — E559 Vitamin D deficiency, unspecified: Secondary | ICD-10-CM | POA: Insufficient documentation

## 2014-02-28 DIAGNOSIS — Z299 Encounter for prophylactic measures, unspecified: Secondary | ICD-10-CM

## 2014-02-28 DIAGNOSIS — Z136 Encounter for screening for cardiovascular disorders: Secondary | ICD-10-CM

## 2014-02-28 DIAGNOSIS — R635 Abnormal weight gain: Secondary | ICD-10-CM

## 2014-02-28 LAB — CBC WITH DIFFERENTIAL/PLATELET
BASOS PCT: 0.4 % (ref 0.0–3.0)
Basophils Absolute: 0 10*3/uL (ref 0.0–0.1)
EOS PCT: 0.7 % (ref 0.0–5.0)
Eosinophils Absolute: 0 10*3/uL (ref 0.0–0.7)
HCT: 46.3 % — ABNORMAL HIGH (ref 36.0–46.0)
Hemoglobin: 15.3 g/dL — ABNORMAL HIGH (ref 12.0–15.0)
LYMPHS PCT: 37.7 % (ref 12.0–46.0)
Lymphs Abs: 2.1 10*3/uL (ref 0.7–4.0)
MCHC: 33.1 g/dL (ref 30.0–36.0)
MCV: 83.3 fl (ref 78.0–100.0)
MONOS PCT: 7.4 % (ref 3.0–12.0)
Monocytes Absolute: 0.4 10*3/uL (ref 0.1–1.0)
Neutro Abs: 3 10*3/uL (ref 1.4–7.7)
Neutrophils Relative %: 53.8 % (ref 43.0–77.0)
Platelets: 232 10*3/uL (ref 150.0–400.0)
RBC: 5.56 Mil/uL — AB (ref 3.87–5.11)
RDW: 13.4 % (ref 11.5–14.6)
WBC: 5.6 10*3/uL (ref 4.5–10.5)

## 2014-02-28 LAB — TSH: TSH: 1.11 u[IU]/mL (ref 0.35–5.50)

## 2014-02-28 LAB — BASIC METABOLIC PANEL
BUN: 13 mg/dL (ref 6–23)
CALCIUM: 9.6 mg/dL (ref 8.4–10.5)
CHLORIDE: 105 meq/L (ref 96–112)
CO2: 26 mEq/L (ref 19–32)
CREATININE: 0.7 mg/dL (ref 0.4–1.2)
GFR: 93.81 mL/min (ref >60.00)
Glucose, Bld: 94 mg/dL (ref 70–99)
Potassium: 4.1 mEq/L (ref 3.5–5.1)
Sodium: 141 mEq/L (ref 135–145)

## 2014-02-28 LAB — LIPID PANEL
CHOL/HDL RATIO: 5
Cholesterol: 228 mg/dL — ABNORMAL HIGH (ref 0–200)
HDL: 46.7 mg/dL (ref >39.00)
LDL CALC: 149 mg/dL — AB (ref 0–99)
Triglycerides: 164 mg/dL — ABNORMAL HIGH (ref 0.0–149.0)
VLDL: 32.8 mg/dL (ref 0.0–40.0)

## 2014-02-28 LAB — HEPATIC FUNCTION PANEL
ALK PHOS: 79 U/L (ref 39–117)
ALT: 31 U/L (ref 0–35)
AST: 25 U/L (ref 0–37)
Albumin: 4.5 g/dL (ref 3.5–5.2)
BILIRUBIN DIRECT: 0.1 mg/dL (ref 0.0–0.3)
TOTAL PROTEIN: 7.1 g/dL (ref 6.0–8.3)
Total Bilirubin: 0.6 mg/dL (ref 0.3–1.2)

## 2014-02-28 LAB — T4, FREE: Free T4: 0.87 ng/dL (ref 0.60–1.60)

## 2014-02-28 NOTE — Progress Notes (Signed)
Pre visit review using our clinic review tool, if applicable. No additional management support is needed unless otherwise documented below in the visit note.   Chief Complaint  Patient presents with  . Follow-up    Pt is fasting.  Would like lab work.    HPI: Heather Lamb Patient comes in scheduled as an acute SDA visit but would like routine labs monitoring preventive Medicare visit and other issues. She is now on [employer] healthcare used to get labs done through her work she is now since retired. She was on prescription vitamin D and is now on over-the-counter only sees her gynecologist every few years. Would like that checked also She's concerned difficulty losing weight despite vigorous exercise she went on a cruise with her family a month or so ago and only gained 2 pounds since that she got that off fairly easily. Doing strength training  2 days a week and exercise . caclium and vit d .  Otherwise feels healthy. ROS: See pertinent positives and negatives per HPI.  Past Medical History  Diagnosis Date  . History of UTI     Family History  Problem Relation Age of Onset  . Arthritis Father   . Heart disease Father   . Hypertension      parent  . Stroke    . Hyperlipidemia      History   Social History  . Marital Status: Married    Spouse Name: N/A    Number of Children: N/A  . Years of Education: N/A   Social History Main Topics  . Smoking status: Never Smoker   . Smokeless tobacco: None  . Alcohol Use: No  . Drug Use: No  . Sexual Activity: None   Other Topics Concern  . None   Social History Narrative   HHof 2    Retired Investment banker, operational degree   g4 p4    Neg tad husband smokes but no ets.     Outpatient Encounter Prescriptions as of 02/28/2014  Medication Sig  . calcium carbonate 1250 MG capsule Take 1,250 mg by mouth 2 (two) times daily with a meal.    . cetirizine (ZYRTEC) 10 MG tablet Take 10 mg by mouth daily.  . cholecalciferol  (VITAMIN D) 1000 UNITS tablet Take 1,000 Units by mouth daily.    . [DISCONTINUED] amoxicillin-clavulanate (AUGMENTIN) 875-125 MG per tablet Take 1 tablet by mouth every 12 (twelve) hours.  . [DISCONTINUED] guaiFENesin (MUCINEX) 600 MG 12 hr tablet Take 1 tablet (600 mg total) by mouth 2 (two) times daily. X 10 days    EXAM:  BP 142/80  Pulse 77  Temp(Src) 98.2 F (36.8 C) (Oral)  Ht 5' 6.5" (1.689 m)  Wt 188 lb (85.276 kg)  BMI 29.89 kg/m2  SpO2 97%  Body mass index is 29.89 kg/(m^2).  GENERAL: vitals reviewed and listed above, alert, oriented, appears well hydrated and in no acute distress HEENT: atraumatic, conjunctiva  clear, no obvious abnormalities on inspection of external nose and ears OP : no lesion edema or exudate  MS: moves all extremities without noticeable focal  abnormality PSYCH: pleasant and cooperative, no obvious depression or anxiety  ASSESSMENT AND PLAN:  Discussed the following assessment and plan:  Weight gain - Plan: Basic metabolic panel, CBC with Differential, Hepatic function panel, Lipid panel, TSH, T4, free, Vit D  25 hydroxy (rtn osteoporosis monitoring)  Preventive measure - Plan: Basic metabolic panel, CBC with Differential, Hepatic function panel,  Lipid panel, TSH, T4, free, Vit D  25 hydroxy (rtn osteoporosis monitoring)  Unspecified vitamin D deficiency - History of same prescription per GYN - Plan: Basic metabolic panel, CBC with Differential, Hepatic function panel, Lipid panel, TSH, T4, free, Vit D  25 hydroxy (rtn osteoporosis monitoring) We'll no charge for this visit because of the scheduling difficulty and I will have labs done today and have her come back for her preventive visit orders placed today. -Patient advised to return or notify health care team  if symptoms worsen ,persist or new concerns arise.  There are no Patient Instructions on file for this visit.   Neta MendsWanda K. Panosh M.D.

## 2014-03-01 LAB — VITAMIN D 25 HYDROXY (VIT D DEFICIENCY, FRACTURES): Vit D, 25-Hydroxy: 42 ng/mL (ref 30–89)

## 2014-03-08 ENCOUNTER — Encounter: Payer: Self-pay | Admitting: Internal Medicine

## 2014-03-08 ENCOUNTER — Ambulatory Visit (INDEPENDENT_AMBULATORY_CARE_PROVIDER_SITE_OTHER): Payer: Medicare Other | Admitting: Internal Medicine

## 2014-03-08 VITALS — BP 120/74 | HR 80 | Temp 98.5°F | Ht 66.5 in | Wt 188.0 lb

## 2014-03-08 DIAGNOSIS — Z0000 Encounter for general adult medical examination without abnormal findings: Secondary | ICD-10-CM

## 2014-03-08 DIAGNOSIS — Z23 Encounter for immunization: Secondary | ICD-10-CM

## 2014-03-08 DIAGNOSIS — E559 Vitamin D deficiency, unspecified: Secondary | ICD-10-CM

## 2014-03-08 DIAGNOSIS — E785 Hyperlipidemia, unspecified: Secondary | ICD-10-CM | POA: Insufficient documentation

## 2014-03-08 DIAGNOSIS — Z8619 Personal history of other infectious and parasitic diseases: Secondary | ICD-10-CM

## 2014-03-08 DIAGNOSIS — R635 Abnormal weight gain: Secondary | ICD-10-CM

## 2014-03-08 MED ORDER — VALACYCLOVIR HCL 1 G PO TABS: 2000.0000 mg | ORAL_TABLET | Freq: Two times a day (BID) | ORAL | Status: DC

## 2014-03-08 NOTE — Progress Notes (Signed)
Chief Complaint  Patient presents with  . Medicare Wellness    HPI: Patient comes in today for Preventive Medicare wellness visit . No major injuries, ed visits ,hospitalizations , new medications since last visit. Hx of cold sores  .  Would like medication in case needed Is very frustrated difficulty losing weight she has lost 7-8 pounds since November with excessive exercise and attention to diet although she does eat out 3 times a week she uses wisely. Hasn't had alcohol but rarely. Did have a trip to [place] but stated she did not gain weight over that trip and pay attention to reading. Is a bit frustrated she has not lost more weight.  Health Maintenance  Topic Date Due  . Pneumococcal Polysaccharide Vaccine Age 66 And Over  12/08/2014 (Originally 11/19/2013)  . Influenza Vaccine  07/08/2014  . Mammogram  10/13/2015  . Colonoscopy  10/09/2016  . Tetanus/tdap  10/09/2016  . Zostavax  Completed   Health Maintenance Review     Hearing: Good tinnitus is gone when she stopped the ibuprofen  Vision:  No limitations at present . Last eye check UTD  Safety:  Has smoke detector and wears seat belts.  No firearms. No excess sun exposure. Sees dentist regularly.  Falls: No  Advance directive :  Reviewed  Has one.  Memory: Felt to be good  , no concern from her or her family.  Depression: No anhedonia unusual crying or depressive symptoms  Nutrition: Eats well balanced diet; adequate calcium and vitamin D. No swallowing chewing problems.  Injury: no major injuries in the last six months.  Other healthcare providers:  Reviewed today .  Social:  Lives with spouse married. No pets.   Preventive parameters: up-to-date  Reviewed   ADLS:   There are no problems or need for assistance  driving, feeding, obtaining food, dressing, toileting and bathing, managing money using phone. She is independent.  EXERCISE/ HABITS  Per week  . Physically active yard work Chief Operating Officerjam resistance  aerobic No tobacco    etoh social but none recently   ROS:  GEN/ HEENT: No fever, significant weight changes sweats headaches vision problems hearing changes, CV/ PULM; No chest pain shortness of breath cough, syncope,edema  change in exercise tolerance. GI /GU: No adominal pain, vomiting, change in bowel habits. No blood in the stool. No significant GU symptoms. SKIN/HEME: ,no acute skin rashes suspicious lesions or bleeding. No lymphadenopathy, nodules, masses.  NEURO/ PSYCH:  No neurologic signs such as weakness numbness. No depression anxiety. IMM/ Allergy: No unusual infections.  Allergy .   REST of 12 system review negative except as per HPI   Past Medical History  Diagnosis Date  . History of UTI   . Right lower lobe pneumonia 10/11/2011    Family History  Problem Relation Age of Onset  . Arthritis Father   . Heart disease Father   . Hypertension      parent  . Stroke    . Hyperlipidemia      History   Social History  . Marital Status: Married    Spouse Name: N/A    Number of Children: N/A  . Years of Education: N/A   Social History Main Topics  . Smoking status: Never Smoker   . Smokeless tobacco: None  . Alcohol Use: No  . Drug Use: No  . Sexual Activity: None   Other Topics Concern  . None   Social History Narrative   HHof 2  Retired Investment banker, operational degree   g4 p4    Neg tad husband smokes but no ets.     Outpatient Encounter Prescriptions as of 03/08/2014  Medication Sig  . calcium carbonate 1250 MG capsule Take 1,250 mg by mouth 2 (two) times daily with a meal.    . cetirizine (ZYRTEC) 10 MG tablet Take 10 mg by mouth daily.  . cholecalciferol (VITAMIN D) 1000 UNITS tablet Take 1,000 Units by mouth daily.    . valACYclovir (VALTREX) 1000 MG tablet Take 2 tablets (2,000 mg total) by mouth 2 (two) times daily. For cold sores.    EXAM:  BP 120/74  Pulse 80  Temp(Src) 98.5 F (36.9 C) (Oral)  Ht 5' 6.5" (1.689 m)  Wt 188 lb (85.276 kg)   BMI 29.89 kg/m2  SpO2 97%  Body mass index is 29.89 kg/(m^2). Wt Readings from Last 3 Encounters:  03/08/14 188 lb (85.276 kg)  02/28/14 188 lb (85.276 kg)  06/06/13 193 lb (87.544 kg)    Physical Exam: Vital signs reviewed [id number]:WRUE is a well-developed well-nourished alert cooperative   who appears stated age in no acute distress.  HEENT: normocephalic atraumatic , Eyes: PERRL EOM's full, conjunctiva clear, Nares: paten,t no deformity discharge or tenderness., Ears: no deformity EAC's clear TMs with normal landmarks. Mouth: clear OP, no lesions, edema.  Moist mucous membranes. Dentition in adequate repair. NECK: supple without masses, thyromegaly or bruits. CHEST/PULM:  Clear to auscultation and percussion breath sounds equal no wheeze , rales or rhonchi. No chest wall deformities or tenderness. Mild to minimal kyphosis he Breast: normal by inspection . No dimpling, discharge, masses, tenderness or discharge  CV: PMI is nondisplaced, S1 S2 no gallops, murmurs, rubs. Peripheral pulses are full without delay.No JVD .  ABDOMEN: Bowel sounds normal nontender  No guard or rebound, no hepato splenomegal no CVA tenderness.   Extremtities:  No clubbing cyanosis or edema, no acute joint swelling or redness no focal atrophy NEURO:  Oriented x3, cranial nerves 3-12 appear to be intact, no obvious focal weakness,gait within normal limits no abnormal reflexes or asymmetrical SKIN: No acute rashes normal turgor, color, no bruising or petechiae. Son changes PSYCH: Oriented, good eye contact, no obvious depression anxiety, cognition and judgment appear normal. LN: no cervical axillary inguinal adenopathy No noted deficits in memory, attention, and speech.   Lab Results  Component Value Date   WBC 5.6 02/28/2014   HGB 15.3* 02/28/2014   HCT 46.3* 02/28/2014   PLT 232.0 02/28/2014   GLUCOSE 94 02/28/2014   CHOL 228* 02/28/2014   TRIG 164.0* 02/28/2014   HDL 46.70 02/28/2014   LDLCALC 149* 02/28/2014    ALT 31 02/28/2014   AST 25 02/28/2014   NA 141 02/28/2014   K 4.1 02/28/2014   CL 105 02/28/2014   CREATININE 0.7 02/28/2014   BUN 13 02/28/2014   CO2 26 02/28/2014   TSH 1.11 02/28/2014    ASSESSMENT AND PLAN:  Discussed the following assessment and plan:  Welcome to Medicare preventive visit - prevnar today decline flu vaccine  Other and unspecified hyperlipidemia - Continue healthy lifestyle weight loss attempts and recheck in 6 months or 12 months if she gets the weight off  Need for prophylactic vaccination against Streptococcus pneumoniae (pneumococcus) - Plan: Pneumococcal conjugate vaccine 13-valent IM  Weight gain - No obvious disease cause. Extensive counseling continue her good exercise and reduced intake strategies discussed at length  Hx of cold sores - rx for  valtrex on hand  Unspecified vitamin D deficiency - level good can use OTC meds   Patient Care Team: Madelin Headings, MD as PCP - General (Internal Medicine) Alison Murray, MD (Obstetrics and Gynecology)  Patient Instructions  Continue lifestyle intervention healthy eating and exercise . 150 minutes of exercise weeksminimum  ,   Avoid trans fats and processed foods;  Increase fresh fruits and veges to 5 servings per day. And avoid sweet beverages  Including tea and juice.  Valtrex as needed. Try decreasing volume of food eaten out.  Be patient  .  Can use OTC vit d  Check lipids in 6-12 months or as needed.     Neta Mends. Rhys Lichty M.D.  Tera Helper given strategies and fu as needed  extended visit time Pre visit review using our clinic review tool, if applicable. No additional management support is needed unless otherwise documented below in the visit note.

## 2014-03-08 NOTE — Patient Instructions (Signed)
Continue lifestyle intervention healthy eating and exercise . 150 minutes of exercise weeksminimum  ,   Avoid trans fats and processed foods;  Increase fresh fruits and veges to 5 servings per day. And avoid sweet beverages  Including tea and juice.  Valtrex as needed. Try decreasing volume of food eaten out.  Be patient  .  Can use OTC vit d  Check lipids in 6-12 months or as needed.

## 2014-07-25 ENCOUNTER — Ambulatory Visit: Payer: Medicare Other | Admitting: Internal Medicine

## 2014-07-25 ENCOUNTER — Emergency Department (HOSPITAL_COMMUNITY)
Admission: EM | Admit: 2014-07-25 | Discharge: 2014-07-25 | Disposition: A | Discharge status: Home / Self Care | Payer: Medicare Other | Attending: Emergency Medicine | Admitting: Emergency Medicine

## 2014-07-25 ENCOUNTER — Encounter (HOSPITAL_COMMUNITY): Payer: Self-pay | Admitting: Emergency Medicine

## 2014-07-25 ENCOUNTER — Emergency Department (HOSPITAL_COMMUNITY): Payer: Medicare Other

## 2014-07-25 DIAGNOSIS — N200 Calculus of kidney: Secondary | ICD-10-CM | POA: Diagnosis not present

## 2014-07-25 DIAGNOSIS — R109 Unspecified abdominal pain: Secondary | ICD-10-CM | POA: Diagnosis present

## 2014-07-25 DIAGNOSIS — Z8701 Personal history of pneumonia (recurrent): Secondary | ICD-10-CM | POA: Insufficient documentation

## 2014-07-25 DIAGNOSIS — Z87440 Personal history of urinary (tract) infections: Secondary | ICD-10-CM | POA: Diagnosis not present

## 2014-07-25 HISTORY — DX: Calculus of kidney: N20.0

## 2014-07-25 LAB — URINALYSIS, ROUTINE W REFLEX MICROSCOPIC
Bilirubin Urine: NEGATIVE
GLUCOSE, UA: NEGATIVE mg/dL
Ketones, ur: 15 mg/dL — AB
LEUKOCYTES UA: NEGATIVE
Nitrite: NEGATIVE
PH: 5.5 (ref 5.0–8.0)
Protein, ur: NEGATIVE mg/dL
Specific Gravity, Urine: >1.03 — ABNORMAL HIGH (ref 1.005–1.030)
Urobilinogen, UA: 0.2 mg/dL (ref 0.0–1.0)

## 2014-07-25 LAB — COMPREHENSIVE METABOLIC PANEL
ALK PHOS: 83 U/L (ref 39–117)
ALT: 22 U/L (ref 0–35)
AST: 18 U/L (ref 0–37)
Albumin: 3.8 g/dL (ref 3.5–5.2)
Anion gap: 16 — ABNORMAL HIGH (ref 5–15)
BILIRUBIN TOTAL: 0.3 mg/dL (ref 0.3–1.2)
BUN: 19 mg/dL (ref 6–23)
CHLORIDE: 103 meq/L (ref 96–112)
CO2: 25 mEq/L (ref 19–32)
Calcium: 8.8 mg/dL (ref 8.4–10.5)
Creatinine, Ser: 0.58 mg/dL (ref 0.50–1.10)
GFR calc Af Amer: >90 mL/min (ref >90)
GFR calc non Af Amer: >90 mL/min (ref >90)
Glucose, Bld: 179 mg/dL — ABNORMAL HIGH (ref 70–99)
POTASSIUM: 3.4 meq/L — AB (ref 3.7–5.3)
SODIUM: 144 meq/L (ref 137–147)
Total Protein: 6.4 g/dL (ref 6.0–8.3)

## 2014-07-25 LAB — CBC
HEMATOCRIT: 40.3 % (ref 36.0–46.0)
HEMOGLOBIN: 13.2 g/dL (ref 12.0–15.0)
MCH: 27.5 pg (ref 26.0–34.0)
MCHC: 32.8 g/dL (ref 30.0–36.0)
MCV: 84 fL (ref 78.0–100.0)
Platelets: 192 10*3/uL (ref 150–400)
RBC: 4.8 MIL/uL (ref 3.87–5.11)
RDW: 13.2 % (ref 11.5–15.5)
WBC: 9.3 10*3/uL (ref 4.0–10.5)

## 2014-07-25 LAB — URINE MICROSCOPIC-ADD ON

## 2014-07-25 MED ORDER — ONDANSETRON 8 MG PO TBDP: 8.0000 mg | ORAL_TABLET | Freq: Three times a day (TID) | ORAL | Status: DC | PRN

## 2014-07-25 MED ORDER — MORPHINE SULFATE 4 MG/ML IJ SOLN
4.0000 mg | INTRAMUSCULAR | Status: AC | PRN
  Administered 2014-07-25 (×2): 4 mg via INTRAVENOUS
  Filled 2014-07-25 (×2): qty 1

## 2014-07-25 MED ORDER — OXYCODONE-ACETAMINOPHEN 5-325 MG PO TABS: 1.0000 | ORAL_TABLET | ORAL | Status: DC | PRN

## 2014-07-25 MED ORDER — OXYCODONE-ACETAMINOPHEN 5-325 MG PO TABS
1.0000 | ORAL_TABLET | Freq: Four times a day (QID) | ORAL | Status: DC | PRN
  Administered 2014-07-25: 2 via ORAL
  Filled 2014-07-25: qty 2

## 2014-07-25 MED ORDER — ONDANSETRON HCL 4 MG/2ML IJ SOLN
4.0000 mg | Freq: Once | INTRAMUSCULAR | Status: AC
  Administered 2014-07-25: 4 mg via INTRAVENOUS
  Filled 2014-07-25: qty 2

## 2014-07-25 MED ORDER — KETOROLAC TROMETHAMINE 30 MG/ML IJ SOLN
30.0000 mg | Freq: Once | INTRAMUSCULAR | Status: AC
  Administered 2014-07-25: 30 mg via INTRAVENOUS
  Filled 2014-07-25: qty 1

## 2014-07-25 NOTE — ED Notes (Addendum)
Pt reports ongoing R flank pain radiating in to R groin x 1 week but tonight is worse. reports pressure with urination and nausea. Hx kidney stones. Pt has had 2 oxycodone's within the past hour.

## 2014-07-25 NOTE — ED Provider Notes (Signed)
CSN: [id number]     Arrival date & time 07/25/14  0157 History   First MD Initiated Contact with Patient 07/25/14 0226     Chief Complaint  Patient presents with  . Flank Pain     (Consider location/radiation/quality/duration/timing/severity/associated sxs/prior Treatment) HPI -year-old female presents to emergency department from home with complaint of right flank pain radiating into her groin.  Patient reports over last several weeks she has had what she describes as bladder spasms.  She thought maybe she was you coming in with a urinary tract infection or may be another kidney stone.  She denies any fever or chills.  Tonight around 11:30 midnight the pain became suddenly and severely worse.  Patient took Percocet at home without improvement.  Patient reports she attempted to get in touch with her urologist, but was referred back to her primary care Dr. Past Medical History  Diagnosis Date  . History of UTI   . Right lower lobe pneumonia 10/11/2011  . Kidney stones    Past Surgical History  Procedure Laterality Date  . No past surgeries     Family History  Problem Relation Age of Onset  . Arthritis Father   . Heart disease Father   . Hypertension      parent  . Stroke    . Hyperlipidemia     History  Substance Use Topics  . Smoking status: Never Smoker   . Smokeless tobacco: Not on file  . Alcohol Use: No   OB History   Grav Para Term Preterm Abortions TAB SAB Ect Mult Living                 Review of Systems  See History of Present Illness; otherwise all other systems are reviewed and negative   Allergies  Review of patient's allergies indicates no known allergies.  Home Medications   Prior to Admission medications   Medication Sig Start Date End Date Taking? Authorizing Provider  ondansetron (ZOFRAN ODT) 8 MG disintegrating tablet Take 1 tablet (8 mg total) by mouth every 8 (eight) hours as needed for nausea or vomiting. 07/25/14   Olivia Mackielga M Ilhan Debenedetto, MD   oxyCODONE-acetaminophen (PERCOCET/ROXICET) 5-325 MG per tablet Take 1-2 tablets by mouth every 4 (four) hours as needed for severe pain. 07/25/14   Olivia Mackielga M Leonila Speranza, MD   BP 107/59  Pulse 85  Temp(Src) 98 F (36.7 C) (Oral)  Resp 20  SpO2 98% Physical Exam  Nursing note and vitals reviewed. Constitutional: She is oriented to person, place, and time. She appears well-developed and well-nourished. She appears distressed.  HENT:  Head: Normocephalic and atraumatic.  Nose: Nose normal.  Mouth/Throat: Oropharynx is clear and moist.  Eyes: Conjunctivae and EOM are normal. Pupils are equal, round, and reactive to light.  Neck: Normal range of motion. Neck supple. No JVD present. No tracheal deviation present. No thyromegaly present.  Cardiovascular: Normal rate, regular rhythm, normal heart sounds and intact distal pulses.  Exam reveals no gallop and no friction rub.   No murmur heard. Pulmonary/Chest: Effort normal and breath sounds normal. No stridor. No respiratory distress. She has no wheezes. She has no rales. She exhibits no tenderness.  Abdominal: Soft. Bowel sounds are normal. She exhibits no distension and no mass. There is no tenderness. There is no rebound and no guarding.  Musculoskeletal: Normal range of motion. She exhibits no edema and no tenderness.  Lymphadenopathy:    She has no cervical adenopathy.  Neurological: She is alert and oriented  to person, place, and time. She exhibits normal muscle tone. Coordination normal.  Skin: Skin is warm and dry. No rash noted. No erythema. No pallor.  Psychiatric: She has a normal mood and affect. Her behavior is normal. Judgment and thought content normal.    ED Course  Procedures (including critical care time) Labs Review Labs Reviewed  COMPREHENSIVE METABOLIC PANEL - Abnormal; Notable for the following:    Potassium 3.4 (*)    Glucose, Bld 179 (*)    Anion gap 16 (*)    All other components within normal limits  URINALYSIS, ROUTINE  W REFLEX MICROSCOPIC - Abnormal; Notable for the following:    APPearance CLOUDY (*)    Specific Gravity, Urine >1.030 (*)    Hgb urine dipstick LARGE (*)    Ketones, ur 15 (*)    All other components within normal limits  URINE MICROSCOPIC-ADD ON - Abnormal; Notable for the following:    Squamous Epithelial / LPF FEW (*)    Bacteria, UA FEW (*)    All other components within normal limits  CBC    Imaging Review Ct Abdomen Pelvis Wo Contrast  07/25/2014   CLINICAL DATA:  Right flank pain  EXAM: CT ABDOMEN AND PELVIS WITHOUT CONTRAST  TECHNIQUE: Multidetector CT imaging of the abdomen and pelvis was performed following the standard protocol without IV contrast.  COMPARISON:  Prior CT from 10/07/2010  FINDINGS: Mild subsegmental atelectasis seen dependently within the visualized lung bases. No pleural or pericardial effusion.  The liver demonstrates a normal unenhanced appearance. Gallbladder within normal limits. No biliary dilatation. The spleen, adrenal glands, and pancreas demonstrate a normal unenhanced appearance.  Left kidney is unremarkable without evidence of nephrolithiasis or hydronephrosis. No stones seen along the course of the left renal collecting system.  On the right, there is an obstructive 6 mm calculus at the right UVJ with secondary moderate to severe right hydroureteronephrosis. No other stone seen within the right kidney or along the course of the right renal collecting system.  Small hiatal hernia noted. No evidence of bowel obstruction. No acute inflammatory changes seen about the bowels.  Bladder within normal limits.  Uterus and ovaries are unremarkable.  No free air or fluid. No pathologically enlarged intra-abdominal pelvic lymph nodes.  No acute osseous abnormality. No worrisome lytic or blastic osseous lesions. Severe degenerative changes present at L1-2 and L5-S1.  IMPRESSION: 1. 6 mm obstructive stone at the right UVJ with secondary moderate to severe right  hydroureteronephrosis. 2. No other acute intra-abdominal or pelvic process identified. 3. Small hiatal hernia.   Electronically Signed   By: Rise Mu M.D.   On: 07/25/2014 04:22     EKG Interpretation None      MDM   Final diagnoses:  Kidney stone on right side    68 year old female with what sounds to be renal colic on the right, history of same.  Urine with a bit of blood in it.  We'll get CT abdomen pelvis without contrast to evaluate further for stone and location.    Olivia Mackie, MD 07/25/14 [phone]

## 2014-07-25 NOTE — Discharge Instructions (Signed)
You have a 6 mm stone about to pass into your bladder.  This should hopefully pass on its own.  Drink plenty of fluids.  Take pain and nausea medications as prescribed.  Return to the ER for worsening pain, or nausea despite medications, fever, or other new concerning symptoms.  Follow up with urology as listed above.  Your CT scan shows no other kidney stones in both kidneys that may cause problems in the future.  If you have problems with kidney stones either with your current stone or with future stones, the urology group prefers that you be seen at the [facility] ER versus [facility].  They are better equipped to handle complications at the [facility].      Kidney Stones Kidney stones (urolithiasis) are deposits that form inside your kidneys. The intense pain is caused by the stone moving through the urinary tract. When the stone moves, the ureter goes into spasm around the stone. The stone is usually passed in the urine.  CAUSES   A disorder that makes certain neck glands produce too much parathyroid hormone (primary hyperparathyroidism).  A buildup of uric acid crystals, similar to gout in your joints.  Narrowing (stricture) of the ureter.  A kidney obstruction present at birth (congenital obstruction).  Previous surgery on the kidney or ureters.  Numerous kidney infections. SYMPTOMS   Feeling sick to your stomach (nauseous).  Throwing up (vomiting).  Blood in the urine (hematuria).  Pain that usually spreads (radiates) to the groin.  Frequency or urgency of urination. DIAGNOSIS   Taking a history and physical exam.  Blood or urine tests.  CT scan.  Occasionally, an examination of the inside of the urinary bladder (cystoscopy) is performed. TREATMENT   Observation.  Increasing your fluid intake.  Extracorporeal shock wave lithotripsy--This is a noninvasive procedure that uses shock waves to break up kidney stones.  Surgery may be needed if you have  severe pain or persistent obstruction. There are various surgical procedures. Most of the procedures are performed with the use of small instruments. Only small incisions are needed to accommodate these instruments, so recovery time is minimized. The size, location, and chemical composition are all important variables that will determine the proper choice of action for you. Talk to your health care provider to better understand your situation so that you will minimize the risk of injury to yourself and your kidney.  HOME CARE INSTRUCTIONS   Drink enough water and fluids to keep your urine clear or pale yellow. This will help you to pass the stone or stone fragments.  Strain all urine through the provided strainer. Keep all particulate matter and stones for your health care provider to see. The stone causing the pain may be as small as a grain of salt. It is very important to use the strainer each and every time you pass your urine. The collection of your stone will allow your health care provider to analyze it and verify that a stone has actually passed. The stone analysis will often identify what you can do to reduce the incidence of recurrences.  Only take over-the-counter or prescription medicines for pain, discomfort, or fever as directed by your health care provider.  Make a follow-up appointment with your health care provider as directed.  Get follow-up X-rays if required. The absence of pain does not always mean that the stone has passed. It may have only stopped moving. If the urine remains completely obstructed, it can cause loss of  kidney function or even complete destruction of the kidney. It is your responsibility to make sure X-rays and follow-ups are completed. Ultrasounds of the kidney can show blockages and the status of the kidney. Ultrasounds are not associated with any radiation and can be performed easily in a matter of minutes. SEEK MEDICAL CARE IF:  You experience pain that is  progressive and unresponsive to any pain medicine you have been prescribed. SEEK IMMEDIATE MEDICAL CARE IF:   Pain cannot be controlled with the prescribed medicine.  You have a fever or shaking chills.  The severity or intensity of pain increases over 18 hours and is not relieved by pain medicine.  You develop a new onset of abdominal pain.  You feel faint or pass out.  You are unable to urinate. MAKE SURE YOU:   Understand these instructions.  Will watch your condition.  Will get help right away if you are not doing well or get worse. Document Released: 11/24/2005 Document Revised: 07/27/2013 Document Reviewed: 04/27/2013 [facility] Patient Information 2015 [place], [place]. This information is not intended to replace advice given to you by your health care provider. Make sure you discuss any questions you have with your health care provider.

## 2014-07-27 ENCOUNTER — Encounter (HOSPITAL_COMMUNITY): Payer: Self-pay | Admitting: *Deleted

## 2014-07-27 ENCOUNTER — Other Ambulatory Visit: Payer: Self-pay | Admitting: Urology

## 2014-08-03 ENCOUNTER — Ambulatory Visit (HOSPITAL_COMMUNITY): Admission: RE | Admit: 2014-08-03 | Payer: Medicare Other | Source: Ambulatory Visit | Admitting: Urology

## 2014-08-03 SURGERY — LITHOTRIPSY, ESWL
Anesthesia: LOCAL | Laterality: Right

## 2014-08-23 ENCOUNTER — Ambulatory Visit (INDEPENDENT_AMBULATORY_CARE_PROVIDER_SITE_OTHER): Payer: Medicare Other | Admitting: Internal Medicine

## 2014-08-23 ENCOUNTER — Encounter: Payer: Self-pay | Admitting: Internal Medicine

## 2014-08-23 VITALS — BP 156/100 | Temp 97.9°F | Ht 66.5 in | Wt 198.0 lb

## 2014-08-23 DIAGNOSIS — F410 Panic disorder [episodic paroxysmal anxiety] without agoraphobia: Secondary | ICD-10-CM

## 2014-08-23 DIAGNOSIS — F4322 Adjustment disorder with anxiety: Secondary | ICD-10-CM | POA: Insufficient documentation

## 2014-08-23 DIAGNOSIS — Z6379 Other stressful life events affecting family and household: Secondary | ICD-10-CM

## 2014-08-23 HISTORY — DX: Panic disorder (episodic paroxysmal anxiety): F41.0

## 2014-08-23 MED ORDER — ESCITALOPRAM OXALATE 10 MG PO TABS: ORAL_TABLET | ORAL | Status: DC

## 2014-08-23 MED ORDER — LORAZEPAM 0.5 MG PO TABS: 0.5000 mg | ORAL_TABLET | Freq: Two times a day (BID) | ORAL | Status: DC | PRN

## 2014-08-23 NOTE — Progress Notes (Signed)
Pre visit review using our clinic review tool, if applicable. No additional management support is needed unless otherwise documented below in the visit note.  Chief Complaint  Patient presents with  . Panic Attack    Has a history of panic attacks.  Was given Xanax in 2006 to take 0.5 mg bid PRN as needed.  Pt thinks the attacks may be due to her mother recently having a stroke.  She is now responsible for her care.  Margarit has confrontations with her brother over their mother's care.    HPI: Patient Heather Lamb  comes in today  for  new problem evaluation. 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 Who has been managing many things in the family including declining health of her 51 year old mother. And arranging finances and guardianship decisions.  Mother had a Stroke in may age 46 arranging to go to [facility] [place] .  She is now on Medicaid and in a safer place. However over the last 3 months she is having increasing anxiety that she calls panic attacks that if she didn't know better from a previous symptom would've sent her to the emergency room. Remote history of some anxiety around external factors and used Xanax that helped her and then she hasn't taken any for some many years her bottle is so old from 2006 not really depressed just anxious that is getting worse.    Panic attacks act like  Heart attacks   But know what it is  Accidentally left  Purse in restaurant  And  Hard to deal .  Night at times and talks.   self down.  Now think she needs some help has not preferred to take a medicine every day but will do wet we think is best. Stopped caffiene  and  Art sweetness  .  No alcohol significance tries to exercise regularly bp had been ok  Had renal stone.   ROS: See pertinent positives and negatives per HPI. See above previous notes last TSH was in the last 6 months. Not  taking oxycodone that was for her kidney stone when she went to the emergency room.  Past Medical History  Diagnosis Date  . History of UTI   . Right lower lobe pneumonia 10/11/2011  . Kidney stones     Family History  Problem Relation Age of Onset  . Arthritis Father   . Heart disease Father   . Hypertension      parent  . Stroke    . Hyperlipidemia      History   Social History  . Marital Status: Married    Spouse Name: N/A    Number of Children: N/A  . Years of Education: N/A   Social History Main Topics  . Smoking status: Never Smoker   . Smokeless tobacco: None  . Alcohol Use: No  . Drug Use: No  . Sexual Activity: None   Other Topics Concern  . None   Social History Narrative   HHof 2    Retired Investment banker, operational degree   g4 p4    Neg tad husband smokes but no ets.     Outpatient Encounter Prescriptions as of 08/23/2014  Medication Sig  . calcium carbonate (OS-CAL) 600 MG TABS tablet Take 600 mg by mouth 2 (two) times daily with a meal. With vit D3  . cetirizine (ZYRTEC)  10 MG tablet Take 10 mg by mouth as needed for allergies.  Marland Kitchen escitalopram (LEXAPRO) 10 MG tablet Take 5 mg po qd and increase to 10 mg per day.after 1-2 weeks if needed  . LORazepam (ATIVAN) 0.5 MG tablet Take 1 tablet (0.5 mg total) by mouth 2 (two) times daily as needed for anxiety.  . ondansetron (ZOFRAN ODT) 8 MG disintegrating tablet Take 1 tablet (8 mg total) by mouth every 8 (eight) hours as needed for nausea or vomiting.  Marland Kitchen oxyCODONE-acetaminophen (PERCOCET/ROXICET) 5-325 MG per tablet Take 1-2 tablets by mouth every 4 (four) hours as needed for severe pain.    EXAM:  BP 156/100  Temp(Src) 97.9 F (36.6 C) (Oral)  Ht 5' 6.5" (1.689 m)  Wt 198 lb (89.812 kg)  BMI 31.48 kg/m2  Body mass index is 31.48 kg/(m^2).  GENERAL: vitals reviewed and listed above, alert, oriented, appears well hydrated and in no acute distress HEENT: atraumatic, conjunctiva  clear, no obvious  abnormalities on inspection of external nose and ears PSYCH: pleasant and cooperative,  normal cognition speech and attention and thought mildly anxious but looks well ASSESSMENT AND PLAN:  Discussed the following assessment and plan:  Adjustment disorder with anxious mood - external factors many;escalating;advise controller meds because of in for the long haul and add on meds fu3-4 weeks the assess   Panic attacks  Stress due to illness of family member - managing Good insight  Escalating over months  Maternal health issues family responsibioties other  No aggravating  Behaviors . Told patient I prefer to not use Xanax because of its rebound anxiety and either use lorazepam or Clonopin of the Klonopin is only FDA approved.  I reported that because of the long haul that this is going to be that she may do better on low-dose controller medication such as an SSRI Celexa or Lexapro in addition to an as needed benzo  counseling helps some but  May have enough other support  .   Expectant management.side effects  Timing   -Patient advised to return or notify health care team  if symptoms worsen ,persist or new concerns arise.  Patient Instructions  I think that controller medication low dose would be helpful in addition to add on medication. Take this every day and add on lorazepam ( related to xanax ). counseling  CBT has been used  Consider if not controlled with above.   Neta Mends. Panosh M.D.

## 2014-08-23 NOTE — Patient Instructions (Addendum)
I think that controller medication low dose would be helpful in addition to add on medication. Take this every day and add on lorazepam ( related to xanax ). counseling  CBT has been used  Consider if not controlled with above.

## 2014-09-15 ENCOUNTER — Other Ambulatory Visit: Payer: Self-pay

## 2014-09-15 DIAGNOSIS — Z1231 Encounter for screening mammogram for malignant neoplasm of breast: Secondary | ICD-10-CM

## 2014-10-11 ENCOUNTER — Encounter: Payer: Self-pay | Admitting: Internal Medicine

## 2014-10-17 ENCOUNTER — Other Ambulatory Visit: Payer: Self-pay | Admitting: Internal Medicine

## 2014-10-17 NOTE — Telephone Encounter (Signed)
Please refill x 6 months worth 

## 2014-10-17 NOTE — Telephone Encounter (Signed)
Sent to the pharmacy by e-scribe. 

## 2014-11-09 ENCOUNTER — Ambulatory Visit
Admission: RE | Admit: 2014-11-09 | Discharge: 2014-11-09 | Disposition: A | Discharge status: Home / Self Care | Payer: Medicare Other | Source: Ambulatory Visit

## 2014-11-09 DIAGNOSIS — Z1231 Encounter for screening mammogram for malignant neoplasm of breast: Secondary | ICD-10-CM

## 2015-01-10 ENCOUNTER — Encounter: Payer: Self-pay | Admitting: Podiatry

## 2015-01-10 ENCOUNTER — Ambulatory Visit (INDEPENDENT_AMBULATORY_CARE_PROVIDER_SITE_OTHER): Payer: 59 | Admitting: Podiatry

## 2015-01-10 ENCOUNTER — Ambulatory Visit (INDEPENDENT_AMBULATORY_CARE_PROVIDER_SITE_OTHER): Payer: 59

## 2015-01-10 VITALS — BP 146/83 | HR 72 | Resp 12 | Ht 67.0 in | Wt 184.0 lb

## 2015-01-10 DIAGNOSIS — M79661 Pain in right lower leg: Secondary | ICD-10-CM

## 2015-01-10 DIAGNOSIS — M6790 Unspecified disorder of synovium and tendon, unspecified site: Secondary | ICD-10-CM

## 2015-01-10 DIAGNOSIS — M67969 Unspecified disorder of synovium and tendon, unspecified lower leg: Secondary | ICD-10-CM

## 2015-01-10 MED ORDER — PREDNISONE 10 MG PO KIT: PACK | ORAL | Status: DC

## 2015-01-10 NOTE — Progress Notes (Signed)
   Subjective:    Patient ID: Heather Lamb, female    DOB: [birth year], 69 y.o.   MRN: [id number]  HPI Comments: N foot and ankle pain L right foot and medial ankle D summer 2015 O no known injury C sharp pain and decreased range of motion, burning pain after lateral or medial movement A uneven surfaces, rotate the foot lateral and medial T elastic brace support, soaks, massage, and decrease exercise, rest  Foot Pain   the sharp pain occurs when the heel was everted upon weight-bearing. The symptoms do not occur with dorsi and plantar flexion of the ankle on level ground.    Review of Systems  All other systems reviewed and are negative.      Objective:   Physical Exam   Orientated 3  Vascular: DP pulses 2/4 bilaterally PT pulses 2/4 bilaterally Capillary reflex immediate bilaterally  Neurological: Ankle reflex equal and reactive bilaterally Vibratory sensation intact bilaterally Sensation to 10 g monofilament wire intact 5/5 bilaterally Negative Tinel's sign over tarsal tunnel right Paresthesias medial right heel/arch when right heel was everted duplicating the sharp pain that patient describes.  Dermatological: Texture and turgor within normal limits  Musculoskeletal: Patient is able to heel off unilaterally and bilaterally, however, has difficulty healing off on right There is palpable tenderness in the retro-medial malleolar area right over tendon area without any palpable lesions  X-ray exam in the right ankle was within normal limits      Assessment & Plan:   Assessment: Tarsal tunnel syndrome right? Medial right ankle tendinopathy  Plan: Place patient ankle stabilizer to maintain true sagittal plane motion and reduce eversion and inversion of the ankle Instructed patient to wear right ankle stabilizer on a continuous basis except when sleeping and showering Rx 6 day 10 mg prednisone Dosepak  Reevaluate 6 weeks

## 2015-01-10 NOTE — Patient Instructions (Signed)
Wear ankle stabilizer on your right ankle except when sleeping and showering Take 6 days of oral prednisone

## 2015-01-16 ENCOUNTER — Ambulatory Visit: Payer: Self-pay

## 2015-02-21 ENCOUNTER — Ambulatory Visit: Payer: Medicare Other | Admitting: Podiatry

## 2015-03-14 ENCOUNTER — Ambulatory Visit: Payer: Medicare Other | Admitting: Podiatry

## 2015-04-18 ENCOUNTER — Other Ambulatory Visit (INDEPENDENT_AMBULATORY_CARE_PROVIDER_SITE_OTHER): Payer: Medicare Other

## 2015-04-18 DIAGNOSIS — E785 Hyperlipidemia, unspecified: Secondary | ICD-10-CM | POA: Diagnosis not present

## 2015-04-18 DIAGNOSIS — Z0000 Encounter for general adult medical examination without abnormal findings: Secondary | ICD-10-CM

## 2015-04-18 LAB — CBC WITH DIFFERENTIAL/PLATELET
BASOS PCT: 0.5 % (ref 0.0–3.0)
Basophils Absolute: 0 10*3/uL (ref 0.0–0.1)
EOS ABS: 0.1 10*3/uL (ref 0.0–0.7)
Eosinophils Relative: 1.1 % (ref 0.0–5.0)
HEMATOCRIT: 44.1 % (ref 36.0–46.0)
Hemoglobin: 14.9 g/dL (ref 12.0–15.0)
LYMPHS ABS: 2 10*3/uL (ref 0.7–4.0)
Lymphocytes Relative: 40.9 % (ref 12.0–46.0)
MCHC: 33.9 g/dL (ref 30.0–36.0)
MCV: 81.3 fl (ref 78.0–100.0)
MONO ABS: 0.4 10*3/uL (ref 0.1–1.0)
Monocytes Relative: 8.3 % (ref 3.0–12.0)
Neutro Abs: 2.4 10*3/uL (ref 1.4–7.7)
Neutrophils Relative %: 49.2 % (ref 43.0–77.0)
Platelets: 228 10*3/uL (ref 150.0–400.0)
RBC: 5.43 Mil/uL — AB (ref 3.87–5.11)
RDW: 13.6 % (ref 11.5–15.5)
WBC: 4.9 10*3/uL (ref 4.0–10.5)

## 2015-04-18 LAB — COMPREHENSIVE METABOLIC PANEL
ALBUMIN: 4 g/dL (ref 3.5–5.2)
ALK PHOS: 75 U/L (ref 39–117)
ALT: 24 U/L (ref 0–35)
AST: 19 U/L (ref 0–37)
BUN: 15 mg/dL (ref 6–23)
CO2: 30 mEq/L (ref 19–32)
Calcium: 9.3 mg/dL (ref 8.4–10.5)
Chloride: 103 mEq/L (ref 96–112)
Creatinine, Ser: 0.55 mg/dL (ref 0.40–1.20)
GFR: 117.39 mL/min (ref >60.00)
Glucose, Bld: 97 mg/dL (ref 70–99)
Potassium: 4.2 mEq/L (ref 3.5–5.1)
SODIUM: 138 meq/L (ref 135–145)
Total Bilirubin: 0.6 mg/dL (ref 0.2–1.2)
Total Protein: 6.6 g/dL (ref 6.0–8.3)

## 2015-04-18 LAB — LIPID PANEL
CHOL/HDL RATIO: 5
CHOLESTEROL: 207 mg/dL — AB (ref 0–200)
HDL: 45.8 mg/dL (ref >39.00)
LDL Cholesterol: 128 mg/dL — ABNORMAL HIGH (ref 0–99)
NonHDL: 161.2
Triglycerides: 168 mg/dL — ABNORMAL HIGH (ref 0.0–149.0)
VLDL: 33.6 mg/dL (ref 0.0–40.0)

## 2015-04-18 LAB — TSH: TSH: 1.22 u[IU]/mL (ref 0.35–4.50)

## 2015-04-24 ENCOUNTER — Ambulatory Visit (INDEPENDENT_AMBULATORY_CARE_PROVIDER_SITE_OTHER): Payer: Medicare Other | Admitting: Internal Medicine

## 2015-04-24 ENCOUNTER — Encounter: Payer: Self-pay | Admitting: Internal Medicine

## 2015-04-24 VITALS — BP 110/80 | HR 94 | Temp 97.8°F | Ht 66.0 in | Wt 195.0 lb

## 2015-04-24 DIAGNOSIS — Z23 Encounter for immunization: Secondary | ICD-10-CM | POA: Diagnosis not present

## 2015-04-24 DIAGNOSIS — Z0000 Encounter for general adult medical examination without abnormal findings: Secondary | ICD-10-CM

## 2015-04-24 DIAGNOSIS — E785 Hyperlipidemia, unspecified: Secondary | ICD-10-CM | POA: Diagnosis not present

## 2015-04-24 NOTE — Progress Notes (Signed)
Chief Complaint  Patient presents with  . Annual Exam    HPI: Heather Lamb 69 y.o. comes in today for Preventive Medicare wellness visit . No major injuries, ed visits ,hospitalizations , new medications since last visit. Had renal stone and passed it  Not taking ativan   Doing better  Mood stable no depression .  Eating better execising at gym Health Maintenance  Topic Date Due  . PNA vac Low Risk Adult (2 of 2 - PPSV23) 03/09/2015  . INFLUENZA VACCINE  08/09/2015 (Originally 07/09/2015)  . COLONOSCOPY  10/09/2016  . TETANUS/TDAP  10/09/2016  . MAMMOGRAM  11/09/2016  . DEXA SCAN  Completed  . ZOSTAVAX  Completed   Health Maintenance Review LIFESTYLE:  Exercise:  yardwork gym Tobacco/ETS:noi Alcohol: per day  Sugar beverages: no Sleep: 7-8 hours  Drug use: no Bone density:  2011   Call  Before   For dexa.  Colonoscopy:   Stool cards to do .  [facility] MEDICARE DOCUMENT QUESTIONS  TO SCAN    Hearing: good  Vision:  No limitations at present . Last eye check UTD  Safety:  Has smoke detector and wears seat belts.  No firearms. No excess sun exposure. Sees dentist regularly.  Falls: no  Advance directive :  Reviewed  Has one.  Memory: Felt to be good  , no concern from her or her family.  Depression: No anhedonia unusual crying or depressive symptoms  Nutrition: Eats well balanced diet; adequate calcium and vitamin D. No swallowing chewing problems.  Injury: no major injuries in the last six months.  Other healthcare providers:  Reviewed today .  Social:  Lives with spouse married.  2-4 dogs    Preventive parameters: up-to-date  Reviewed   ADLS:   There are no problems or need for assistance  driving, feeding, obtaining food, dressing, toileting and bathing, managing money using phone. She is independent.    ROS:  GEN/ HEENT: No fever, significant weight changes sweats headaches vision problems hearing changes, CV/ PULM; No chest pain shortness of breath  cough, syncope,edema  change in exercise tolerance. GI /GU: No adominal pain, vomiting, change in bowel habits. No blood in the stool. No significant GU symptoms. SKIN/HEME: ,no acute skin rashes suspicious lesions or bleeding. No lymphadenopathy, nodules, masses.  NEURO/ PSYCH:  No neurologic signs such as weakness numbness. No depression anxiety. IMM/ Allergy: No unusual infections.  Allergy .   REST of 12 system review negative except as per HPI   Past Medical History  Diagnosis Date  . History of UTI   . Right lower lobe pneumonia 10/11/2011  . Kidney stones   . Anxiety   . Allergy   . Panic attacks 08/23/2014    Family History  Problem Relation Age of Onset  . Arthritis Father   . Heart disease Father   . Hypertension      parent  . Stroke    . Hyperlipidemia    family long lived to 72s   History   Social History  . Marital Status: Married    Spouse Name: N/A  . Number of Children: N/A  . Years of Education: N/A   Social History Main Topics  . Smoking status: Never Smoker   . Smokeless tobacco: Not on file  . Alcohol Use: No  . Drug Use: No  . Sexual Activity: Not on file   Other Topics Concern  . None   Social History Narrative   HHof 2  Retired Theatre stage manager degree   g4 p4    Neg tad husband smokes but no ets.     Outpatient Encounter Prescriptions as of 04/24/2015  Medication Sig  . calcium carbonate (OS-CAL) 600 MG TABS tablet Take 600 mg by mouth 2 (two) times daily with a meal. With vit D3  . cetirizine (ZYRTEC) 10 MG tablet Take 10 mg by mouth as needed for allergies.  Marland Kitchen LORazepam (ATIVAN) 0.5 MG tablet Take 1 tablet (0.5 mg total) by mouth 2 (two) times daily as needed for anxiety.  . [DISCONTINUED] PredniSONE 10 MG KIT 6 day tapering dose   No facility-administered encounter medications on file as of 04/24/2015.    EXAM:  BP 110/80 mmHg  Pulse 94  Temp(Src) 97.8 F (36.6 C) (Oral)  Ht '5\' 6"'  (1.676 m)  Wt 195 lb (88.451 kg)  BMI  31.49 kg/m2  Body mass index is 31.49 kg/(m^2).  Physical Exam: Vital signs reviewed JXB:JYNW is a well-developed well-nourished alert cooperative   who appears stated age in no acute distress.  HEENT: normocephalic atraumatic , Eyes: PERRL EOM's full, conjunctiva clear, Nares: paten,t no deformity discharge or tenderness., Ears: no deformity EAC's clear TMs with normal landmarks. Mouth: clear OP, no lesions, edema.  Moist mucous membranes. Dentition in adequate repair. NECK: supple without masses, thyromegaly or bruits. CHEST/PULM:  Clear to auscultation and percussion breath sounds equal no wheeze , rales or rhonchi. No chest wall deformities or tenderness. Breast: normal by inspection . No dimpling, discharge, masses, tenderness or discharge . CV: PMI is nondisplaced, S1 S2 no gallops, murmurs, rubs. Peripheral pulses are full without delay.No JVD .  ABDOMEN: Bowel sounds normal nontender  No guard or rebound, no hepato splenomegal no CVA tenderness.  No hernia. Extremtities:  No clubbing cyanosis or edema, no acute joint swelling or redness no focal atrophy NEURO:  Oriented x3, cranial nerves 3-12 appear to be intact, no obvious focal weakness,gait within normal limits no abnormal reflexes or asymmetrical SKIN: No acute rashes normal turgor, color, no bruising or petechiae. PSYCH: Oriented, good eye contact, no obvious depression anxiety, cognition and judgment appear normal. LN: no cervical axillary inguinal adenopathy No noted deficits in memory, attention, and speech.   Lab Results  Component Value Date   WBC 4.9 04/18/2015   HGB 14.9 04/18/2015   HCT 44.1 04/18/2015   PLT 228.0 04/18/2015   GLUCOSE 97 04/18/2015   CHOL 207* 04/18/2015   TRIG 168.0* 04/18/2015   HDL 45.80 04/18/2015   LDLCALC 128* 04/18/2015   ALT 24 04/18/2015   AST 19 04/18/2015   NA 138 04/18/2015   K 4.2 04/18/2015   CL 103 04/18/2015   CREATININE 0.55 04/18/2015   BUN 15 04/18/2015   CO2 30  04/18/2015   TSH 1.22 04/18/2015   BP Readings from Last 3 Encounters:  04/24/15 110/80  01/10/15 146/83  08/23/14 156/100   Wt Readings from Last 3 Encounters:  04/24/15 195 lb (88.451 kg)  01/10/15 184 lb (83.462 kg)  08/23/14 198 lb (89.812 kg)     ASSESSMENT AND PLAN:  Discussed the following assessment and plan:  Need for pneumococcal vaccine - Plan: Pneumococcal polysaccharide vaccine 23-valent greater than or equal to 2yo subcutaneous/IM  Patient Care Team: Burnis Medin, MD as PCP - General (Internal Medicine) Peri Maris, MD (Obstetrics and Gynecology)  Patient Instructions  Continue lifestyle intervention healthy eating and exercise . Healthy lifestyle includes : At least 150 minutes of  exercise weeks  , weight at healthy levels, which is usually   BMI 19-25. Avoid trans fats and processed foods;  Increase fresh fruits and veges to 5 servings per day. And avoid sweet beverages including tea and juice. Mediterranean diet with olive oil and nuts have been noted to be heart and brain healthy . Avoid tobacco products . Limit  alcohol to  7 per week for women and 14 servings for men.  Get adequate sleep . Wear seat belts . Don't text and drive .   dexa scan next year :   Call for order before  Your mammo.  Stool cards    Get [place] results.      Standley Brooking. Govind Furey M.D.

## 2015-04-24 NOTE — Patient Instructions (Addendum)
Continue lifestyle intervention healthy eating and exercise . Healthy lifestyle includes : At least 150 minutes of exercise weeks  , weight at healthy levels, which is usually   BMI 19-25. Avoid trans fats and processed foods;  Increase fresh fruits and veges to 5 servings per day. And avoid sweet beverages including tea and juice. Mediterranean diet with olive oil and nuts have been noted to be heart and brain healthy . Avoid tobacco products . Limit  alcohol to  7 per week for women and 14 servings for men.  Get adequate sleep . Wear seat belts . Don't text and drive .   dexa scan next year :   Call for order before  Your mammo.  Stool cards    Get us results.

## 2015-04-24 NOTE — Progress Notes (Signed)
Pre visit review using our clinic review tool, if applicable. No additional management support is needed unless otherwise documented below in the visit note. 

## 2015-08-10 ENCOUNTER — Telehealth: Payer: Self-pay | Admitting: Internal Medicine

## 2015-08-10 ENCOUNTER — Telehealth: Payer: Self-pay

## 2015-08-10 MED ORDER — DOXYCYCLINE HYCLATE 100 MG PO TABS: 100.0000 mg | ORAL_TABLET | Freq: Two times a day (BID) | ORAL | Status: DC

## 2015-08-10 NOTE — Telephone Encounter (Signed)
Spoke to the pt.  Advised that Dr. Fabian Sharp sent in an antibiotic to the pharmacy.  She should take a picture of the bite mark and save for [facility] to review.  Also advised to use bug spray in the future.  Pt understands.  Will pick up doxy at the pharmacy.

## 2015-08-10 NOTE — Telephone Encounter (Signed)
The pt called and stated she has a palm sized red spot where a tick bite her.  (Tick was found on her back on 8/23) she is hoping to get advice on what to do.  (she states she does not have fever, denies chills, denies pain)   Pt callback - [phone]

## 2015-08-10 NOTE — Telephone Encounter (Signed)
Patient Name: MAKAYLEE Thune  DOB: 1946/07/08    Initial Comment Caller says she left a message. Says she pulled a tick off her back on Aug 23 and the red spot spread; it is the size of the palm of her hand    Nurse Assessment  Nurse: Stefano Gaul, RN, Dwana Curd Date/Time (Eastern Time): 08/10/2015 10:36:26 AM  Confirm and document reason for call. If symptomatic, describe symptoms. ---Caller states she pulled a tick off her back on Aug 23. She has a red spot as big as the palm of her hand. Does not look like a bulls eye. No fever. Has used neosporin. She is out of town in [place]. She has a prescription for Vibramycin that is for her dog who had ticks.  Has the patient traveled out of the country within the last 30 days? ---No  Does the patient require triage? ---Yes  Related visit to physician within the last 2 weeks? ---No  Does the PT have any chronic conditions? (i.e. diabetes, asthma, etc.) ---No     Guidelines    Guideline Title Affirmed Question Affirmed Notes  Tick Bite [1] Red or very tender (to touch) area AND [2] started over 24 hours after the bite    Final Disposition User   See Physician within 24 Hours Stringer, RN, Dwana Curd    Comments  pt is out of town in [place], [place] and is wanting some antibiotics called in to the [facility] in [place] at [phone]. She does not want to go to urgent care.   Referrals  GO TO FACILITY REFUSED   Disagree/Comply: Disagree  Disagree/Comply Reason: Disagree with instructions

## 2015-08-10 NOTE — Telephone Encounter (Signed)
Please transfer the pt to [facility] (triage).  Thanks!

## 2015-08-10 NOTE — Telephone Encounter (Signed)
Spoke to the pt.  She pulled the tick off on 07/31/15.  There is swelling at the bite area and redness.  She denies weakness, nausea, fever, vomiting, palpitations, rash, joint pain, numbness and confusion.  She would like an antibiotic called to the pharmacy.  Please advise.  Thanks!

## 2015-08-10 NOTE — Telephone Encounter (Signed)
Called pt back and transferred to [facility]

## 2015-10-10 ENCOUNTER — Other Ambulatory Visit: Payer: Self-pay

## 2015-10-10 ENCOUNTER — Telehealth: Payer: Self-pay | Admitting: Internal Medicine

## 2015-10-10 DIAGNOSIS — Z1231 Encounter for screening mammogram for malignant neoplasm of breast: Secondary | ICD-10-CM

## 2015-10-10 DIAGNOSIS — E2839 Other primary ovarian failure: Secondary | ICD-10-CM

## 2015-10-10 NOTE — Telephone Encounter (Signed)
Okay to order?   Dx Code?

## 2015-10-10 NOTE — Telephone Encounter (Signed)
Ok to do this  Dx estrogen deficient     Put on problem list

## 2015-10-10 NOTE — Telephone Encounter (Signed)
Pt has an appt for mammogram at breast center on 11/15/15 and would like to have bone density test at same time. Please put order in system and notify pt.

## 2015-10-11 ENCOUNTER — Other Ambulatory Visit: Payer: Self-pay | Admitting: Family Medicine

## 2015-10-11 DIAGNOSIS — E2839 Other primary ovarian failure: Secondary | ICD-10-CM

## 2015-10-11 NOTE — Telephone Encounter (Signed)
Order placed in the system. 

## 2015-11-12 ENCOUNTER — Emergency Department (HOSPITAL_COMMUNITY)
Admission: EM | Admit: 2015-11-12 | Discharge: 2015-11-12 | Disposition: A | Discharge status: Home / Self Care | Payer: Medicare Other | Source: Home / Self Care | Attending: Family Medicine | Admitting: Family Medicine

## 2015-11-12 ENCOUNTER — Encounter (HOSPITAL_COMMUNITY): Payer: Self-pay | Admitting: *Deleted

## 2015-11-12 DIAGNOSIS — J039 Acute tonsillitis, unspecified: Secondary | ICD-10-CM | POA: Diagnosis not present

## 2015-11-12 MED ORDER — AMOXICILLIN 500 MG PO CAPS: 500.0000 mg | ORAL_CAPSULE | Freq: Three times a day (TID) | ORAL | Status: DC

## 2015-11-12 NOTE — ED Notes (Signed)
r  Earache      Today   She  Noticed  When  She  Woke  Up  With  The  Symptoms  Pain r  Side  Of  Face  As  Well

## 2015-11-12 NOTE — ED Provider Notes (Signed)
CSN: [id number]     Arrival date & time 11/12/15  1844 History   First MD Initiated Contact with Patient 11/12/15 1951     Chief Complaint  Patient presents with  . Otalgia   (Consider location/radiation/quality/duration/timing/severity/associated sxs/prior Treatment) Patient is a 69 y.o. female presenting with ear pain. The history is provided by the patient.  Otalgia Location:  Right Behind ear:  No abnormality Quality:  Sharp Severity:  Moderate Onset quality:  Gradual Duration:  12 hours Progression:  Worsening Chronicity:  New Associated symptoms: congestion and sore throat   Associated symptoms: no cough, no diarrhea, no ear discharge, no fever, no rash and no rhinorrhea     Past Medical History  Diagnosis Date  . History of UTI   . Right lower lobe pneumonia 10/11/2011  . Kidney stones   . Anxiety   . Allergy   . Panic attacks 08/23/2014   Past Surgical History  Procedure Laterality Date  . No past surgeries    . Tubal ligation  1988   Family History  Problem Relation Age of Onset  . Arthritis Father   . Heart disease Father   . Hypertension      parent  . Stroke    . Hyperlipidemia     Social History  Substance Use Topics  . Smoking status: Never Smoker   . Smokeless tobacco: None  . Alcohol Use: No   OB History    No data available     Review of Systems  Constitutional: Negative.  Negative for fever.  HENT: Positive for congestion, ear pain and sore throat. Negative for ear discharge, postnasal drip and rhinorrhea.   Respiratory: Negative for cough.   Gastrointestinal: Negative for diarrhea.  Skin: Negative for rash.  All other systems reviewed and are negative.   Allergies  Sulfa antibiotics  Home Medications   Prior to Admission medications   Medication Sig Start Date End Date Taking? Authorizing Provider  amoxicillin (AMOXIL) 500 MG capsule Take 1 capsule (500 mg total) by mouth 3 (three) times daily. 11/12/15   Linna Hoff, MD   calcium carbonate (OS-CAL) 600 MG TABS tablet Take 600 mg by mouth 2 (two) times daily with a meal. With vit D3    Historical Provider, MD  cetirizine (ZYRTEC) 10 MG tablet Take 10 mg by mouth as needed for allergies.    Historical Provider, MD  doxycycline (VIBRA-TABS) 100 MG tablet Take 1 tablet (100 mg total) by mouth 2 (two) times daily. 08/10/15   Madelin Headings, MD  LORazepam (ATIVAN) 0.5 MG tablet Take 1 tablet (0.5 mg total) by mouth 2 (two) times daily as needed for anxiety. 08/23/14   Madelin Headings, MD   Meds Ordered and Administered this Visit  Medications - No data to display  BP 143/80 mmHg  Pulse 98  Temp(Src) 99.8 F (37.7 C) (Oral)  Resp 18  SpO2 97% No data found.   Physical Exam  Constitutional: She is oriented to person, place, and time. She appears well-developed and well-nourished. No distress.  HENT:  Right Ear: External ear normal.  Left Ear: External ear normal.  Mouth/Throat: Uvula is midline and mucous membranes are normal. Posterior oropharyngeal erythema present. No oropharyngeal exudate.  Eyes: Conjunctivae are normal. Pupils are equal, round, and reactive to light.  Neck: Normal range of motion. Neck supple.  Cardiovascular: Normal rate, regular rhythm, normal heart sounds and intact distal pulses.   Pulmonary/Chest: Breath sounds normal.  Lymphadenopathy:  She has cervical adenopathy.  Neurological: She is alert and oriented to person, place, and time.  Skin: Skin is warm and dry.  Nursing note and vitals reviewed.   ED Course  Procedures (including critical care time)  Labs Review Labs Reviewed - No data to display  Imaging Review No results found.   Visual Acuity Review  Right Eye Distance:   Left Eye Distance:   Bilateral Distance:    Right Eye Near:   Left Eye Near:    Bilateral Near:         MDM   1. Acute tonsillitis, unspecified etiology        Linna HoffJames D Kindl, MD 11/12/15 2009

## 2015-11-13 ENCOUNTER — Emergency Department (HOSPITAL_COMMUNITY): Payer: Medicare Other

## 2015-11-13 ENCOUNTER — Encounter (HOSPITAL_COMMUNITY): Payer: Self-pay | Admitting: Emergency Medicine

## 2015-11-13 ENCOUNTER — Inpatient Hospital Stay (HOSPITAL_COMMUNITY)
Admission: EM | Admit: 2015-11-13 | Discharge: 2015-11-15 | DRG: 153 | Disposition: A | Discharge status: Home / Self Care | Payer: Medicare Other | Attending: Internal Medicine | Admitting: Internal Medicine

## 2015-11-13 DIAGNOSIS — J0510 Acute epiglottitis without obstruction: Secondary | ICD-10-CM

## 2015-11-13 DIAGNOSIS — B9789 Other viral agents as the cause of diseases classified elsewhere: Secondary | ICD-10-CM | POA: Diagnosis present

## 2015-11-13 DIAGNOSIS — J0430 Supraglottitis, unspecified, without obstruction: Secondary | ICD-10-CM | POA: Diagnosis present

## 2015-11-13 DIAGNOSIS — F419 Anxiety disorder, unspecified: Secondary | ICD-10-CM | POA: Diagnosis present

## 2015-11-13 DIAGNOSIS — E041 Nontoxic single thyroid nodule: Secondary | ICD-10-CM | POA: Diagnosis present

## 2015-11-13 DIAGNOSIS — Z8261 Family history of arthritis: Secondary | ICD-10-CM | POA: Diagnosis not present

## 2015-11-13 DIAGNOSIS — Z8249 Family history of ischemic heart disease and other diseases of the circulatory system: Secondary | ICD-10-CM

## 2015-11-13 DIAGNOSIS — J384 Edema of larynx: Secondary | ICD-10-CM | POA: Diagnosis present

## 2015-11-13 HISTORY — DX: Supraglottitis, unspecified, without obstruction: J04.30

## 2015-11-13 HISTORY — DX: Acute epiglottitis without obstruction: J05.10

## 2015-11-13 LAB — CBC WITH DIFFERENTIAL/PLATELET
Basophils Absolute: 0 10*3/uL (ref 0.0–0.1)
Basophils Relative: 0 %
EOS ABS: 0 10*3/uL (ref 0.0–0.7)
Eosinophils Relative: 0 %
HEMATOCRIT: 43.8 % (ref 36.0–46.0)
HEMOGLOBIN: 14.8 g/dL (ref 12.0–15.0)
Lymphocytes Relative: 9 %
Lymphs Abs: 1.6 10*3/uL (ref 0.7–4.0)
MCH: 28.1 pg (ref 26.0–34.0)
MCHC: 33.8 g/dL (ref 30.0–36.0)
MCV: 83.3 fL (ref 78.0–100.0)
MONOS PCT: 7 %
Monocytes Absolute: 1.1 10*3/uL — ABNORMAL HIGH (ref 0.1–1.0)
NEUTROS ABS: 14.6 10*3/uL — AB (ref 1.7–7.7)
NEUTROS PCT: 84 %
Platelets: 199 10*3/uL (ref 150–400)
RBC: 5.26 MIL/uL — AB (ref 3.87–5.11)
RDW: 13.2 % (ref 11.5–15.5)
WBC: 17.4 10*3/uL — AB (ref 4.0–10.5)

## 2015-11-13 LAB — BASIC METABOLIC PANEL
Anion gap: 7 (ref 5–15)
BUN: 12 mg/dL (ref 6–20)
CO2: 24 mmol/L (ref 22–32)
Calcium: 8.9 mg/dL (ref 8.9–10.3)
Chloride: 107 mmol/L (ref 101–111)
Creatinine, Ser: 0.72 mg/dL (ref 0.44–1.00)
GFR calc non Af Amer: >60 mL/min (ref >60)
Glucose, Bld: 159 mg/dL — ABNORMAL HIGH (ref 65–99)
POTASSIUM: 3.7 mmol/L (ref 3.5–5.1)
SODIUM: 138 mmol/L (ref 135–145)

## 2015-11-13 LAB — MRSA PCR SCREENING: MRSA BY PCR: NEGATIVE

## 2015-11-13 LAB — MONONUCLEOSIS SCREEN: MONO SCREEN: NEGATIVE

## 2015-11-13 MED ORDER — RACEPINEPHRINE HCL 2.25 % IN NEBU
0.5000 mL | INHALATION_SOLUTION | Freq: Once | RESPIRATORY_TRACT | Status: AC
  Administered 2015-11-13: 0.5 mL via RESPIRATORY_TRACT
  Filled 2015-11-13: qty 0.5

## 2015-11-13 MED ORDER — DEXAMETHASONE SODIUM PHOSPHATE 4 MG/ML IJ SOLN
10.0000 mg | Freq: Three times a day (TID) | INTRAMUSCULAR | Status: AC
  Administered 2015-11-13 – 2015-11-15 (×6): 10 mg via INTRAVENOUS
  Filled 2015-11-13 (×8): qty 3

## 2015-11-13 MED ORDER — MORPHINE SULFATE (PF) 2 MG/ML IV SOLN
2.0000 mg | INTRAVENOUS | Status: DC | PRN
  Administered 2015-11-13 (×3): 2 mg via INTRAVENOUS
  Filled 2015-11-13 (×3): qty 1

## 2015-11-13 MED ORDER — IOHEXOL 300 MG/ML  SOLN
75.0000 mL | Freq: Once | INTRAMUSCULAR | Status: AC | PRN
  Administered 2015-11-13: 75 mL via INTRAVENOUS

## 2015-11-13 MED ORDER — GI COCKTAIL ~~LOC~~
30.0000 mL | Freq: Once | ORAL | Status: AC
  Administered 2015-11-13: 30 mL via ORAL
  Filled 2015-11-13: qty 30

## 2015-11-13 MED ORDER — ENOXAPARIN SODIUM 40 MG/0.4ML ~~LOC~~ SOLN
40.0000 mg | SUBCUTANEOUS | Status: DC
  Administered 2015-11-13 – 2015-11-14 (×2): 40 mg via SUBCUTANEOUS
  Filled 2015-11-13 (×2): qty 0.4

## 2015-11-13 MED ORDER — MORPHINE SULFATE (PF) 4 MG/ML IV SOLN
4.0000 mg | Freq: Once | INTRAVENOUS | Status: AC
  Administered 2015-11-13: 4 mg via INTRAVENOUS
  Filled 2015-11-13: qty 1

## 2015-11-13 MED ORDER — POTASSIUM CHLORIDE IN NACL 20-0.45 MEQ/L-% IV SOLN
INTRAVENOUS | Status: DC
  Administered 2015-11-13: 07:00:00 via INTRAVENOUS
  Filled 2015-11-13 (×5): qty 1000

## 2015-11-13 MED ORDER — DEXAMETHASONE SODIUM PHOSPHATE 4 MG/ML IJ SOLN: 4.0000 mg | Freq: Two times a day (BID) | INTRAMUSCULAR | Status: DC

## 2015-11-13 MED ORDER — CETYLPYRIDINIUM CHLORIDE 0.05 % MT LIQD
7.0000 mL | Freq: Two times a day (BID) | OROMUCOSAL | Status: DC
  Administered 2015-11-13 – 2015-11-14 (×4): 7 mL via OROMUCOSAL

## 2015-11-13 MED ORDER — DEXAMETHASONE SODIUM PHOSPHATE 10 MG/ML IJ SOLN
20.0000 mg | Freq: Once | INTRAMUSCULAR | Status: AC
  Administered 2015-11-13: 20 mg via INTRAVENOUS
  Filled 2015-11-13: qty 2

## 2015-11-13 MED ORDER — SODIUM CHLORIDE 0.9 % IV SOLN
3.0000 g | Freq: Once | INTRAVENOUS | Status: AC
  Administered 2015-11-13: 3 g via INTRAVENOUS
  Filled 2015-11-13: qty 3

## 2015-11-13 MED ORDER — ONDANSETRON HCL 4 MG PO TABS: 4.0000 mg | ORAL_TABLET | Freq: Four times a day (QID) | ORAL | Status: DC | PRN

## 2015-11-13 MED ORDER — ONDANSETRON HCL 4 MG/2ML IJ SOLN: 4.0000 mg | Freq: Four times a day (QID) | INTRAMUSCULAR | Status: DC | PRN

## 2015-11-13 MED ORDER — DEXAMETHASONE SODIUM PHOSPHATE 4 MG/ML IJ SOLN
4.0000 mg | Freq: Once | INTRAMUSCULAR | Status: AC
  Administered 2015-11-13: 4 mg via INTRAVENOUS
  Filled 2015-11-13: qty 1

## 2015-11-13 MED ORDER — SODIUM CHLORIDE 0.9 % IV SOLN
3.0000 g | Freq: Four times a day (QID) | INTRAVENOUS | Status: DC
  Administered 2015-11-13 – 2015-11-15 (×10): 3 g via INTRAVENOUS
  Filled 2015-11-13 (×10): qty 3

## 2015-11-13 MED ORDER — DOXYCYCLINE HYCLATE 100 MG IV SOLR
100.0000 mg | Freq: Two times a day (BID) | INTRAVENOUS | Status: DC
  Administered 2015-11-13 – 2015-11-14 (×4): 100 mg via INTRAVENOUS
  Filled 2015-11-13 (×6): qty 100

## 2015-11-13 MED ORDER — OXYMETAZOLINE HCL 0.05 % NA SOLN
2.0000 | Freq: Three times a day (TID) | NASAL | Status: DC
  Filled 2015-11-13: qty 15

## 2015-11-13 NOTE — Progress Notes (Signed)
Pt seen and examined, admitted this am, pls see H&P for details 68/F with acute Epiglottitis, continue IV Unasyn, Decadron, IVF, supportive care ENT Dr.shoemaker consulted, keep NPo except ice pending ENT Eval  Zannie CovePreetha Kearah Gayden, MD

## 2015-11-13 NOTE — Care Management Note (Signed)
Case Management Note  Patient Details  Name: Heather Lamb MRN: [id number] Date of Birth: 1946/07/11  Subjective/Objective:       Adm w suprglottitis             Action/Plan: lives w fam, pcp dr Fabian Sharppanosh   Expected Discharge Date:                  Expected Discharge Plan:     In-House Referral:     Discharge planning Services     Post Acute Care Choice:    Choice offered to:     DME Arranged:    DME Agency:     HH Arranged:    HH Agency:     Status of Service:     Medicare Important Message Given:    Date Medicare IM Given:    Medicare IM give by:    Date Additional Medicare IM Given:    Additional Medicare Important Message give by:     If discussed at Long Length of Stay Meetings, dates discussed:    Additional Comments: ur review done  Hanley HaysDowell, Mattalyn Anderegg T, RN 11/13/2015, 7:14 AM

## 2015-11-13 NOTE — Consult Note (Signed)
ENT CONSULT:  Reason for Consult: Acute sore throat Referring Physician: Hospital Service  Heather Lamb is an 69 y.o. female.  HPI: The patient is a healthy 69 year old white female who presented to the [facility] Emergency department with acute progressive sore throat and difficulty breathing. No significant prior history of sore throat, dysphagia or symptoms of infection. She is a nonsmoker, no diabetes or other underlying medical concerns. Symptoms began 3 days ago with otalgia and sore throat, seen at urgent care and started on amoxicillin. The patient reported increasing symptoms of intense sore throat, odynophagia and some respiratory difficulty with hoarseness and muffled voice.  Past Medical History  Diagnosis Date  . History of UTI   . Right lower lobe pneumonia 10/11/2011  . Kidney stones   . Anxiety   . Allergy   . Panic attacks 08/23/2014    Past Surgical History  Procedure Laterality Date  . No past surgeries    . Tubal ligation  1988    Family History  Problem Relation Age of Onset  . Arthritis Father   . Heart disease Father   . Hypertension      parent  . Stroke    . Hyperlipidemia      Social History:  reports that she has never smoked. She does not have any smokeless tobacco history on file. She reports that she does not drink alcohol or use illicit drugs.  Allergies:  Allergies  Allergen Reactions  . Sulfa Antibiotics Nausea And Vomiting    Medications: I have reviewed the patient's current medications.  Results for orders placed or performed during the hospital encounter of 11/13/15 (from the past 48 hour(s))  CBC with Differential     Status: Abnormal   Collection Time: 11/13/15  1:01 AM  Result Value Ref Range   WBC 17.4 (H) 4.0 - 10.5 K/uL   RBC 5.26 (H) 3.87 - 5.11 MIL/uL   Hemoglobin 14.8 12.0 - 15.0 g/dL   HCT 43.8 36.0 - 46.0 %   MCV 83.3 78.0 - 100.0 fL   MCH 28.1 26.0 - 34.0 pg   MCHC 33.8 30.0 - 36.0 g/dL   RDW 13.2 11.5 - 15.5 %   Platelets 199 150 - 400 K/uL   Neutrophils Relative % 84 %   Neutro Abs 14.6 (H) 1.7 - 7.7 K/uL   Lymphocytes Relative 9 %   Lymphs Abs 1.6 0.7 - 4.0 K/uL   Monocytes Relative 7 %   Monocytes Absolute 1.1 (H) 0.1 - 1.0 K/uL   Eosinophils Relative 0 %   Eosinophils Absolute 0.0 0.0 - 0.7 K/uL   Basophils Relative 0 %   Basophils Absolute 0.0 0.0 - 0.1 K/uL  Basic metabolic panel     Status: Abnormal   Collection Time: 11/13/15  1:01 AM  Result Value Ref Range   Sodium 138 135 - 145 mmol/L   Potassium 3.7 3.5 - 5.1 mmol/L   Chloride 107 101 - 111 mmol/L   CO2 24 22 - 32 mmol/L   Glucose, Bld 159 (H) 65 - 99 mg/dL   BUN 12 6 - 20 mg/dL   Creatinine, Ser 0.72 0.44 - 1.00 mg/dL   Calcium 8.9 8.9 - 10.3 mg/dL   GFR calc non Af Amer >60 >60 mL/min   GFR calc Af Amer >60 >60 mL/min    Comment: (NOTE) The eGFR has been calculated using the CKD EPI equation. This calculation has not been validated in all clinical situations. eGFR's persistently <60 mL/min signify  possible Chronic Kidney Disease.    Anion gap 7 5 - 15  Mononucleosis screen     Status: None   Collection Time: 11/13/15  1:43 AM  Result Value Ref Range   Mono Screen NEGATIVE NEGATIVE  MRSA PCR Screening     Status: None   Collection Time: 11/13/15  5:31 AM  Result Value Ref Range   MRSA by PCR NEGATIVE NEGATIVE    Comment:        The GeneXpert MRSA Assay (FDA approved for NASAL specimens only), is one component of a comprehensive MRSA colonization surveillance program. It is not intended to diagnose MRSA infection nor to guide or monitor treatment for MRSA infections.     Ct Soft Tissue Neck W Contrast  11/13/2015  CLINICAL DATA:  Initial evaluation for acute sore throat with swelling, difficulty swallowing. EXAM: CT NECK WITH CONTRAST TECHNIQUE: Multidetector CT imaging of the neck was performed using the standard protocol following the bolus administration of intravenous contrast. CONTRAST:  57m OMNIPAQUE  IOHEXOL 300 MG/ML  SOLN COMPARISON:  None. FINDINGS: Visualized portions of the brain demonstrate no acute abnormality. Visualized paranasal sinuses and mastoid air cells are clear. Salivary glands including the parotid glands and submandibular glands are within normal limits. Partially visualized oral cavity demonstrates no acute abnormality. Palatine tonsils mildly enlarged and hyper enhancing. The right tonsil is slightly enlarged as compared to the left. Scattered calcified tonsilliths present. Parapharyngeal fat preserved. Nasopharynx within normal limits. Swelling with edema present within the epiglottis with extension along the aryepiglottic folds bilaterally, right greater than left. There is associated mucosal enhancement. Swelling of the supraglottic laryngeal mucosa present as well. Piriform sinuses are largely effaced. Vallecula is partially effaced due to swelling of the epiglottis and aryepiglottic folds. No discrete abscess identified. Mild extension of inflammatory stranding laterally towards the carotid spaces, greater on the right. No retropharyngeal fluid collection. Findings most consistent with supraglottis. There is secondary narrowing of the supraglottic airway which is nearly effaced at its most narrow point near the level of the false cords. True cords themselves are grossly symmetric and within normal limits. The subglottic airway is clear. Scattered shotty subcentimeter cervical lymph nodes noted. No pathologically enlarged lymph nodes identified within the neck. Incidental note made of a 2.1 cm hypodense left thyroid nodule, indeterminate. Partially visualized lungs are clear. Normal intravascular enhancement seen within the neck. No acute osseous abnormality. Moderate degenerative spondylolysis present at C5-6 and C6-7 as well as at T1-2. No worrisome lytic or blastic osseous lesions. IMPRESSION: 1. Prominent swelling with edema involving the supraglottic larynx, including the epiglottis  and aryepiglottic folds, right greater than left, most consistent with acute supraglottis. There is marked narrowing of the supraglottic airway which is nearly effaced near the level of the false cords. Clinical followup to resolution recommended. 2. 2.1 cm left thyroid nodule, indeterminate. Further evaluation with dedicated thyroid ultrasound recommended. This could be performed on a nonemergent basis. Results were called by telephone at the time of interpretation on 11/13/2015 at 3:27 am to Dr. KJola Schmidt, who verbally acknowledged these results. Electronically Signed   By: BJeannine BogaM.D.   On: 11/13/2015 03:44    ROS:ROS 12 systems reviewed and negative except as stated in HPI   Blood pressure 118/62, pulse 97, temperature 98 F (36.7 C), temperature source Oral, resp. rate 11, height '5\' 7"'  (1.702 m), weight 90.9 kg (200 lb 6.4 oz), SpO2 97 %.  PHYSICAL EXAM: General appearance - alert, well appearing, and  in no distress Nose - normal and patent, no erythema, discharge or polyps Mouth - mucous membranes moist, pharynx normal without lesions and 1+ tonsils, no erythema or exudate, no soft tissue edema. Neck - tender to palpation on the right, minimal cervical lymphadenopathy.  PROCEDURE: Flexible Laryngoscopy 4 mm flexible laryngoscope passed through the left nasal passageway without difficulty, normal nasopharynx and oropharynx. The patient has significant erythema and swelling of the epiglottis and aryepiglottic fold bilaterally, greater on the right with mucopurulent discharge. Vocal cord mobility normal, no evidence of vocal cord edema or airway compromise. Moderate pooling of secretions in the posterior hypopharynx.    Studies Reviewed: CT scan of the neck  Assessment/Plan: Patient presents with acute symptoms of severe sore throat and fever. Findings on CT scan consistent with soft tissue edema in the supraglottis, no evidence of abscess or significant adenopathy. Flexible  laryngoscopy shows findings consistent with supraglottic soft tissue edema and possible early epiglottitis. The patient's symptoms have improved with current therapy, continued pain and odynophagia. No respiratory distress, hoarseness or airway compromise. Recommend continuing with current antibiotic and steroid therapy, IV hydration and pain management. The patient may begin ice chips and clear liquid diet as tolerated. Recommend continued monitoring step down unit for any additional airway symptoms. Expect clinical improvement over the next 24-48 hours. Plan discharge on oral antibiotics when stable. Will follow patient's clinical course during hospitalization, please call if any worsening in respiratory symptoms.   Lamb, Heather Diffee 11/13/2015, 11:39 AM

## 2015-11-13 NOTE — ED Provider Notes (Signed)
CSN: [id number]     Arrival date & time 11/13/15  0013 History  By signing my name below, I, Arianna Nassar, attest that this documentation has been prepared under the direction and in the presence of Azalia BilisKevin Kassondra Geil, MD. Electronically Signed: Octavia HeirArianna Nassar, ED Scribe. 11/13/2015. 1:32 AM.     Chief Complaint  Patient presents with  . Sore Throat      The history is provided by the patient. No language interpreter was used.   HPI Comments: Alvina FilbertJanice G Bostwick is a 69 y.o. female who presents to the Emergency Department complaining of constant, moderate, gradual worsening sore throat onset this morning. She has associated difficulty breathing and trouble swallowing. Pt [facility] this evening and was diagnosed with tonsillitis. She took her first prescription of amoxicillin and 650 mg of tylenol this evening to alleviate the symptoms with no relief.  Past Medical History  Diagnosis Date  . History of UTI   . Right lower lobe pneumonia 10/11/2011  . Kidney stones   . Anxiety   . Allergy   . Panic attacks 08/23/2014   Past Surgical History  Procedure Laterality Date  . No past surgeries    . Tubal ligation  1988   Family History  Problem Relation Age of Onset  . Arthritis Father   . Heart disease Father   . Hypertension      parent  . Stroke    . Hyperlipidemia     Social History  Substance Use Topics  . Smoking status: Never Smoker   . Smokeless tobacco: None  . Alcohol Use: No   OB History    No data available     Review of Systems  A complete 10 system review of systems was obtained and all systems are negative except as noted in the HPI and PMH.    Allergies  Sulfa antibiotics  Home Medications   Prior to Admission medications   Medication Sig Start Date End Date Taking? Authorizing Provider  amoxicillin (AMOXIL) 500 MG capsule Take 1 capsule (500 mg total) by mouth 3 (three) times daily. 11/12/15   Linna HoffJames D Kindl, MD  calcium carbonate (OS-CAL) 600 MG TABS  tablet Take 600 mg by mouth 2 (two) times daily with a meal. With vit D3    Historical Provider, MD  cetirizine (ZYRTEC) 10 MG tablet Take 10 mg by mouth as needed for allergies.    Historical Provider, MD  doxycycline (VIBRA-TABS) 100 MG tablet Take 1 tablet (100 mg total) by mouth 2 (two) times daily. 08/10/15   Madelin HeadingsWanda K Panosh, MD  LORazepam (ATIVAN) 0.5 MG tablet Take 1 tablet (0.5 mg total) by mouth 2 (two) times daily as needed for anxiety. 08/23/14   Madelin HeadingsWanda K Panosh, MD   Triage vitals: BP 154/82 mmHg  Pulse 112  Temp(Src) 98 F (36.7 C) (Oral)  Resp 16  SpO2 98% Physical Exam  Constitutional: She is oriented to person, place, and time. She appears well-developed and well-nourished. No distress.  HENT:  Head: Normocephalic and atraumatic.  Uvula midline, mild swelling of tonsils bilaterally, right sided neck swelling  Eyes: EOM are normal.  Neck: Normal range of motion.  Cardiovascular: Normal rate, regular rhythm and normal heart sounds.   Pulmonary/Chest: Effort normal and breath sounds normal.  Abdominal: Soft. She exhibits no distension. There is no tenderness.  Musculoskeletal: Normal range of motion.  Neurological: She is alert and oriented to person, place, and time.  Skin: Skin is warm and  dry.  Psychiatric: She has a normal mood and affect. Judgment normal.  Nursing note and vitals reviewed.   ED Course  Procedures  DIAGNOSTIC STUDIES: Oxygen Saturation is 98% on RA, normal by my interpretation.  COORDINATION OF CARE:  1:31 AM Discussed treatment plan with pt at bedside and pt agreed to plan. 3:10 AM Pain is somewhat better but still very painful when she coughs  Labs Review Labs Reviewed  CBC WITH DIFFERENTIAL/PLATELET - Abnormal; Notable for the following:    WBC 17.4 (*)    RBC 5.26 (*)    Neutro Abs 14.6 (*)    Monocytes Absolute 1.1 (*)    All other components within normal limits  BASIC METABOLIC PANEL - Abnormal; Notable for the following:    Glucose,  Bld 159 (*)    All other components within normal limits  CBC - Abnormal; Notable for the following:    WBC 19.8 (*)    All other components within normal limits  BASIC METABOLIC PANEL - Abnormal; Notable for the following:    Glucose, Bld 164 (*)    All other components within normal limits  MRSA PCR SCREENING  MONONUCLEOSIS SCREEN    Imaging Review No results found. I have personally reviewed and evaluated these images and lab results as part of my medical decision-making.   EKG Interpretation None      MDM   Final diagnoses:  None   I spoke with on-call in his throat will evaluate the patient in the morning time.  IV steroids given in the emergency department.  Patient be admitted to hospitalist.   I personally performed the services described in this documentation, which was scribed in my presence. The recorded information has been reviewed and is accurate.      Azalia Bilis, MD 11/30/15 [id number]

## 2015-11-13 NOTE — ED Notes (Signed)
Pt. reports sore throat with swelling onset today [facility] this [facility]  diagnosed with tonsillitis prescribed with oral antibiotic , returned here due to difficulty swallowing , " feels tight" , and difficulty breathing / mild stridor.

## 2015-11-13 NOTE — H&P (Signed)
History and Physical  Patient Name: Heather Lamb     JYN:[id number]    DOB: [birth year]    DOA: 11/13/2015 Referring physician: Azalia BilisKevin Campos, MD PCP: Lorretta HarpPANOSH,[name], MD      Chief Complaint: Sore throat and difficulty breathing  HPI: Heather Lamb is a 69 y.o. female with no significant past medical history who presents with sore throat.  The patient was in her usual state of health until Monday morning. She woke up with right ear discomfort and right-sided sore throat. This worsened over the course of the day until she went to UC in the evening who prescribed amoxicillin for AOM.  The patient went home, but couldn't sleep because of a feeling that her throat was getting more painful, her swallowing is more difficult, and worst of all she couldn't breathe.    In the ED, the patient had leukocytosis to 17K/uL, normal electrolytes, no fever, and mild tachycardia. A CT of the soft tissues of the neck showed epiglottitis and generalized supraglottic edema, slightly asymmetric greater on the right, without specifically involved lymph nodes.    The patient was discussed with Dr. Annalee GentaShoemaker from ENT, who recommended stepdown observation, humidified air, and antibiotics. TRH were asked to admit.     Review of Systems:  Pt complains of sore throat, neck swelling, difficulty with breathing, difficulty swallowing, ear pain. All other systems negative except as just noted or noted in the history of present illness.   Allergies: Sulfa   Home medications: None  Past medical history: 1. Allergic rhinitis 2. Nephrolithiasis 3. Anxiety  Past surgical history: 1. Tubal ligation  Family history:  Father, arthritis, heart disease.   Social History:  Patient lives with her family. She drives, and ambulates without assistance. She needs no assistance with IADLs or ADLs. She has had no sick contacts. She is not a smoker.        Physical Exam: BP 122/51 mmHg  Pulse 99   Temp(Src) 98.9 F (37.2 C) (Oral)  Resp 17  SpO2 95% General appearance: Well-developed, adult female, alert and in mild distress from cough.   Eyes: Anicteric, conjunctiva pink, lids and lashes normal.     ENT: TMs bilaterally are normal.  No nasal deformity, discharge, or epistaxis.  Mallampati 4, without ability to visualize the tonsils or posterior pharynx.  Mild stridor at rest.   Lymph: Some lymphadenopathy is palpated bilaterally.  Possibly R greater than L.  More renderness and overylying erythema on R. Skin: Warm and dry.  Erythema overlying right neck. Cardiac: Tachycardic, nl S1-S2, no murmurs appreciated.  Respiratory: Stridor without incresed work of breathing.  CTAB without rales or wheezes. Abdomen: Abdomen soft without rigidity.  No TTP. No ascites, distension.   Neuro: Sensorium intact and responding to questions, attention normal.  Speech is fluent.  Moves all extremities equally and with normal coordination.    Psych: Behavior appropriate.  Affect normal.  No evidence of aural or visual hallucinations or delusions.       Labs on Admission:  The metabolic panel shows normal sodium, potassium, bicarbonate, and renal function. Mono testing is negative. The complete blood count shows leukocytosis.   Radiological Exams on Admission: Personally reviewed: Ct Soft Tissue Neck W Contrast  11/13/2015  CLINICAL DATA:  Initial evaluation for acute sore throat with swelling, difficulty swallowing. EXAM: CT NECK WITH CONTRAST TECHNIQUE: Multidetector CT imaging of the neck was performed using the standard protocol following the bolus administration of intravenous contrast. CONTRAST:  75mL OMNIPAQUE  IOHEXOL 300 MG/ML  SOLN COMPARISON:  None. FINDINGS: Visualized portions of the brain demonstrate no acute abnormality. Visualized paranasal sinuses and mastoid air cells are clear. Salivary glands including the parotid glands and submandibular glands are within normal limits. Partially  visualized oral cavity demonstrates no acute abnormality. Palatine tonsils mildly enlarged and hyper enhancing. The right tonsil is slightly enlarged as compared to the left. Scattered calcified tonsilliths present. Parapharyngeal fat preserved. Nasopharynx within normal limits. Swelling with edema present within the epiglottis with extension along the aryepiglottic folds bilaterally, right greater than left. There is associated mucosal enhancement. Swelling of the supraglottic laryngeal mucosa present as well. Piriform sinuses are largely effaced. Vallecula is partially effaced due to swelling of the epiglottis and aryepiglottic folds. No discrete abscess identified. Mild extension of inflammatory stranding laterally towards the carotid spaces, greater on the right. No retropharyngeal fluid collection. Findings most consistent with supraglottis. There is secondary narrowing of the supraglottic airway which is nearly effaced at its most narrow point near the level of the false cords. True cords themselves are grossly symmetric and within normal limits. The subglottic airway is clear. Scattered shotty subcentimeter cervical lymph nodes noted. No pathologically enlarged lymph nodes identified within the neck. Incidental note made of a 2.1 cm hypodense left thyroid nodule, indeterminate. Partially visualized lungs are clear. Normal intravascular enhancement seen within the neck. No acute osseous abnormality. Moderate degenerative spondylolysis present at C5-6 and C6-7 as well as at T1-2. No worrisome lytic or blastic osseous lesions. IMPRESSION: 1. Prominent swelling with edema involving the supraglottic larynx, including the epiglottis and aryepiglottic folds, right greater than left, most consistent with acute supraglottis. There is marked narrowing of the supraglottic airway which is nearly effaced near the level of the false cords. Clinical followup to resolution recommended. 2. 2.1 cm left thyroid nodule,  indeterminate. Further evaluation with dedicated thyroid ultrasound recommended. This could be performed on a nonemergent basis. Results were called by telephone at the time of interpretation on 11/13/2015 at 3:27 am to Dr. Azalia Bilis , who verbally acknowledged these results. Electronically Signed   By: Rise Mu M.D.   On: 11/13/2015 03:44       Assessment/Plan 1. Epiglottitis:  The radiology report describes swelling of the epiglottis and other support glottic structures. Clinically at this time the patient feels somewhat improved after Decadron, racemic epinephrine, and Unasyn.   -Admit to stepdown for observation -No personal history of MRSA or exposure -Continue Unasyn -Humidified air, per ENT -Consult ENT, appreciate recommendations, defer collection of cultures to ENT if necessary -Nothing by mouth -M IVF   2. Thyroid nodule:  This is new.  This was noted incidentally on CT. -Follow-up thyroid ultrasound as an outpatient on a nonemergent basis     DVT PPx: Lovenox Diet: Nothing by mouth Consultants: ENT Code Status: Full Medical decision making: What exists of the patient's previous chart was reviewed in depth and the case was discussed with Dr. Patria Mane. Patient seen 4:49 AM on 11/13/2015.  Disposition Plan:  Admit to stepdown. Evaluation later this morning by ENT. If the patient's breathing improves she can be transferred out of stepdown and continued on IV antibiotics. Anticipate admission for 2-3 days.      Alberteen Sam [facility] Pager [phone]

## 2015-11-13 NOTE — Progress Notes (Signed)
ANTIBIOTIC CONSULT NOTE - INITIAL  Pharmacy Consult for Unasyn Indication: epiglottitis  Allergies  Allergen Reactions  . Sulfa Antibiotics Nausea And Vomiting    Patient Measurements:    Vital Signs: Temp: 98.9 F (37.2 C) (12/06 0517) Temp Source: Oral (12/06 0517) BP: 122/51 mmHg (12/06 0500) Pulse Rate: 99 (12/06 0500) Intake/Output from previous day:   Intake/Output from this shift:    Labs:  Recent Labs  11/13/15 0101  WBC 17.4*  HGB 14.8  PLT 199  CREATININE 0.72   CrCl cannot be calculated (Unknown ideal weight.). No results for input(s): VANCOTROUGH, VANCOPEAK, VANCORANDOM, GENTTROUGH, GENTPEAK, GENTRANDOM, TOBRATROUGH, TOBRAPEAK, TOBRARND, AMIKACINPEAK, AMIKACINTROU, AMIKACIN in the last 72 hours.   Microbiology: No results found for this or any previous visit (from the past 720 hour(s)).  Medical History: Past Medical History  Diagnosis Date  . History of UTI   . Right lower lobe pneumonia 10/11/2011  . Kidney stones   . Anxiety   . Allergy   . Panic attacks 08/23/2014    Medications:  Prescriptions prior to admission  Medication Sig Dispense Refill Last Dose  . calcium carbonate (OS-CAL) 600 MG TABS tablet Take 600 mg by mouth 2 (two) times daily with a meal. With vit D3   11/12/2015 at Unknown time  . Cholecalciferol (VITAMIN D PO) Take 1 tablet by mouth daily.   11/12/2015 at Unknown time   Assessment: 69 y.o. F [facility] this evening with sore throat - diagnosed with tonsillitis and given oral abx (amoxicillin). Came back a few hours later with difficulty swallowing and pain. Received Unasyn 3gm in ED ~0430. WBC elevated to 17.4. Afeb. To continue Unasyn for epiglottitis. Estimated CrCl 78 ml/min.  Goal of Therapy:  Resolution of infection  Plan:  Unasyn 3gm IV q6h Will f/u micro data, pt's clinical condition, and renal function  Christoper Fabianaron Alnita Aybar, PharmD, BCPS Clinical pharmacist, pager [phone] 11/13/2015,5:39 AM

## 2015-11-13 NOTE — ED Notes (Signed)
MD at bedside. 

## 2015-11-13 NOTE — ED Notes (Signed)
Pt states she took 650 mg Tylenol around 2130 today.

## 2015-11-13 NOTE — ED Notes (Signed)
hospitalist at bedside

## 2015-11-13 NOTE — ED Notes (Signed)
Attempted to call report

## 2015-11-14 DIAGNOSIS — J0430 Supraglottitis, unspecified, without obstruction: Secondary | ICD-10-CM

## 2015-11-14 DIAGNOSIS — J0510 Acute epiglottitis without obstruction: Principal | ICD-10-CM

## 2015-11-14 LAB — CBC
HEMATOCRIT: 39.7 % (ref 36.0–46.0)
Hemoglobin: 13.5 g/dL (ref 12.0–15.0)
MCH: 28.4 pg (ref 26.0–34.0)
MCHC: 34 g/dL (ref 30.0–36.0)
MCV: 83.4 fL (ref 78.0–100.0)
Platelets: 209 10*3/uL (ref 150–400)
RBC: 4.76 MIL/uL (ref 3.87–5.11)
RDW: 13.6 % (ref 11.5–15.5)
WBC: 19.8 10*3/uL — ABNORMAL HIGH (ref 4.0–10.5)

## 2015-11-14 LAB — BASIC METABOLIC PANEL
ANION GAP: 8 (ref 5–15)
BUN: 10 mg/dL (ref 6–20)
CHLORIDE: 108 mmol/L (ref 101–111)
CO2: 23 mmol/L (ref 22–32)
Calcium: 8.9 mg/dL (ref 8.9–10.3)
Creatinine, Ser: 0.52 mg/dL (ref 0.44–1.00)
GFR calc non Af Amer: >60 mL/min (ref >60)
Glucose, Bld: 164 mg/dL — ABNORMAL HIGH (ref 65–99)
POTASSIUM: 3.8 mmol/L (ref 3.5–5.1)
Sodium: 139 mmol/L (ref 135–145)

## 2015-11-14 NOTE — Progress Notes (Signed)
NURSING PROGRESS NOTE  Heather FilbertJanice G Lamb [id number] Transfer Data: 11/14/2015 6:49 PM Attending Provider: Kathlen ModyVijaya Akula, MD WUJ:WJXBJY,NWGNFPCP:PANOSH,[name], MD Code Status: FULL  Heather FilbertJanice G Lamb is a 69 y.o. female patient transferred from 2H -No acute distress noted.  -No complaints of shortness of breath.  -No complaints of chest pain.   Blood pressure 152/73, pulse 95, temperature 97.9 F (36.6 C), temperature source Oral, resp. rate 18, height 5\' 6"  (1.676 m), weight 92.4 kg (203 lb 11.3 oz), SpO2 99 %.   IV Fluids:  IV in place, occlusive dsg intact without redness, IV cath antecubital right, condition patent and no redness none.   Allergies:  Sulfa antibiotics  Past Medical History:   has a past medical history of History of UTI; Right lower lobe pneumonia (10/11/2011); Kidney stones; Anxiety; Allergy; and Panic attacks (08/23/2014).  Past Surgical History:   has past surgical history that includes No past surgeries and Tubal ligation (1988).  Social History:   reports that she has never smoked. She does not have any smokeless tobacco history on file. She reports that she does not drink alcohol or use illicit drugs.  Skin: intact  Patient/Family orientated to room. Information packet given to patient/family. Admission inpatient armband information verified with patient/family to include name and date of birth and placed on patient arm. Side rails up x 2, fall assessment and education completed with patient/family. Patient/family able to verbalize understanding of risk associated with falls and verbalized understanding to call for assistance before getting out of bed. Call light within reach. Patient/family able to voice and demonstrate understanding of unit orientation instructions.    Will continue to evaluate and treat per MD orders.

## 2015-11-14 NOTE — Progress Notes (Signed)
[place] to [phone]

## 2015-11-14 NOTE — Progress Notes (Signed)
ENT Progress Note: HD #1 Epiglottitis   Subjective: Tol po, pain min  Objective: Vital signs in last 24 hours: Temp:  [97.3 F (36.3 C)-98.6 F (37 C)] 97.3 F (36.3 C) (12/07 0757) Pulse Rate:  [65-86] 80 (12/07 0757) Resp:  [16-20] 18 (12/07 0757) BP: (119-144)/(70-84) 144/79 mmHg (12/07 0757) SpO2:  [93 %-97 %] 96 % (12/07 0757) Weight change:  Last BM Date: 11/12/15  Intake/Output from previous day: 12/06 0701 - 12/07 0700 In: 2865 [P.O.:540; I.V.:1525; IV Piggyback:800] Out: 1775 [Urine:1775] Intake/Output this shift:    Labs:  Recent Labs  11/13/15 0101 11/14/15 0403  WBC 17.4* 19.8*  HGB 14.8 13.5  HCT 43.8 39.7  PLT 199 209    Recent Labs  11/13/15 0101 11/14/15 0403  NA 138 139  K 3.7 3.8  CL 107 108  CO2 24 23  GLUCOSE 159* 164*  BUN 12 10  CALCIUM 8.9 8.9    Studies/Results: Ct Soft Tissue Neck W Contrast  11/13/2015  CLINICAL DATA:  Initial evaluation for acute sore throat with swelling, difficulty swallowing. EXAM: CT NECK WITH CONTRAST TECHNIQUE: Multidetector CT imaging of the neck was performed using the standard protocol following the bolus administration of intravenous contrast. CONTRAST:  75mL OMNIPAQUE IOHEXOL 300 MG/ML  SOLN COMPARISON:  None. FINDINGS: Visualized portions of the brain demonstrate no acute abnormality. Visualized paranasal sinuses and mastoid air cells are clear. Salivary glands including the parotid glands and submandibular glands are within normal limits. Partially visualized oral cavity demonstrates no acute abnormality. Palatine tonsils mildly enlarged and hyper enhancing. The right tonsil is slightly enlarged as compared to the left. Scattered calcified tonsilliths present. Parapharyngeal fat preserved. Nasopharynx within normal limits. Swelling with edema present within the epiglottis with extension along the aryepiglottic folds bilaterally, right greater than left. There is associated mucosal enhancement. Swelling  of the supraglottic laryngeal mucosa present as well. Piriform sinuses are largely effaced. Vallecula is partially effaced due to swelling of the epiglottis and aryepiglottic folds. No discrete abscess identified. Mild extension of inflammatory stranding laterally towards the carotid spaces, greater on the right. No retropharyngeal fluid collection. Findings most consistent with supraglottis. There is secondary narrowing of the supraglottic airway which is nearly effaced at its most narrow point near the level of the false cords. True cords themselves are grossly symmetric and within normal limits. The subglottic airway is clear. Scattered shotty subcentimeter cervical lymph nodes noted. No pathologically enlarged lymph nodes identified within the neck. Incidental note made of a 2.1 cm hypodense left thyroid nodule, indeterminate. Partially visualized lungs are clear. Normal intravascular enhancement seen within the neck. No acute osseous abnormality. Moderate degenerative spondylolysis present at C5-6 and C6-7 as well as at T1-2. No worrisome lytic or blastic osseous lesions. IMPRESSION: 1. Prominent swelling with edema involving the supraglottic larynx, including the epiglottis and aryepiglottic folds, right greater than left, most consistent with acute supraglottis. There is marked narrowing of the supraglottic airway which is nearly effaced near the level of the false cords. Clinical followup to resolution recommended. 2. 2.1 cm left thyroid nodule, indeterminate. Further evaluation with dedicated thyroid ultrasound recommended. This could be performed on a nonemergent basis. Results were called by telephone at the time of interpretation on 11/13/2015 at 3:27 am to Dr. Azalia Bilis , who verbally acknowledged these results. Electronically Signed   By: Rise Mu M.D.   On: 11/13/2015 03:44     PHYSICAL EXAM: OP shows no erythema or d/c No neck swelling  Assessment/Plan: Sig clinical  improvement - tol po, airway stable, sore throat resolved Fever resloved, cont elevated WBC Rec transfer to floor and o/n obs for cont resolution. D/C 12/8 if cont improvement - PO Abx such as Augmentin 875 for 10 days [facility] or prn recurrent sx.    Heather Lamb 11/14/2015, 11:12 AM

## 2015-11-14 NOTE — Progress Notes (Signed)
[facility] PROGRESS NOTE  Heather Lamb ZHY:[id number] DOB: 10/14/1946 DOA: 11/13/2015 PCP: Lorretta HarpPANOSH,Heather KOTVAN, MD  Assessment/Plan: 1. ACUTE EPIGLOTTIS: - admitted to step down and started on IV decadron, IV antibiotics and supportive care. She was started on clears and advanced to regular diet.  - ENT consulted and recommendations given.  - plan for d/c in am if no problems  Code Status: full code.  Family Communication: none at bedside.  Disposition Plan: pending, possible d/c in am.    Consultants:  ENT.  Procedures:  none  Antibiotics:  unasyn and doxycycline.   HPI/Subjective: Breathing is better.   Objective: Filed Vitals:   11/14/15 1637 11/14/15 1828  BP: 125/83 152/73  Pulse:  95  Temp:  97.9 F (36.6 C)  Resp: 18 18    Intake/Output Summary (Last 24 hours) at 11/14/15 1839 Last data filed at 11/14/15 0700  Gross per 24 hour  Intake   1175 ml  Output      0 ml  Net   1175 ml   Filed Weights   11/13/15 0536 11/14/15 1828  Weight: 90.9 kg (200 lb 6.4 oz) 92.4 kg (203 lb 11.3 oz)    Exam:   General:  Alert comfortable.   Cardiovascular: s1s2  Respiratory: ctab  Abdomen: soft NT nd bs+  Musculoskeletal: no pedal edema.   Data Reviewed: Basic Metabolic Panel:  Recent Labs Lab 11/13/15 0101 11/14/15 0403  NA 138 139  K 3.7 3.8  CL 107 108  CO2 24 23  GLUCOSE 159* 164*  BUN 12 10  CREATININE 0.72 0.52  CALCIUM 8.9 8.9   Liver Function Tests: No results for input(s): AST, ALT, ALKPHOS, BILITOT, PROT, ALBUMIN in the last 168 hours. No results for input(s): LIPASE, AMYLASE in the last 168 hours. No results for input(s): AMMONIA in the last 168 hours. CBC:  Recent Labs Lab 11/13/15 0101 11/14/15 0403  WBC 17.4* 19.8*  NEUTROABS 14.6*  --   HGB 14.8 13.5  HCT 43.8 39.7  MCV 83.3 83.4  PLT 199 209   Cardiac Enzymes: No results for input(s): CKTOTAL, CKMB, CKMBINDEX, TROPONINI in the last 168 hours. BNP (last 3  results) No results for input(s): BNP in the last 8760 hours.  ProBNP (last 3 results) No results for input(s): PROBNP in the last 8760 hours.  CBG: No results for input(s): GLUCAP in the last 168 hours.  Recent Results (from the past 240 hour(s))  MRSA PCR Screening     Status: None   Collection Time: 11/13/15  5:31 AM  Result Value Ref Range Status   MRSA by PCR NEGATIVE NEGATIVE Final    Comment:        The GeneXpert MRSA Assay (FDA approved for NASAL specimens only), is one component of a comprehensive MRSA colonization surveillance program. It is not intended to diagnose MRSA infection nor to guide or monitor treatment for MRSA infections.      Studies: Ct Soft Tissue Neck W Contrast  11/13/2015  CLINICAL DATA:  Initial evaluation for acute sore throat with swelling, difficulty swallowing. EXAM: CT NECK WITH CONTRAST TECHNIQUE: Multidetector CT imaging of the neck was performed using the standard protocol following the bolus administration of intravenous contrast. CONTRAST:  75mL OMNIPAQUE IOHEXOL 300 MG/ML  SOLN COMPARISON:  None. FINDINGS: Visualized portions of the brain demonstrate no acute abnormality. Visualized paranasal sinuses and mastoid air cells are clear. Salivary glands including the parotid glands and submandibular glands are within normal limits. Partially visualized oral  cavity demonstrates no acute abnormality. Palatine tonsils mildly enlarged and hyper enhancing. The right tonsil is slightly enlarged as compared to the left. Scattered calcified tonsilliths present. Parapharyngeal fat preserved. Nasopharynx within normal limits. Swelling with edema present within the epiglottis with extension along the aryepiglottic folds bilaterally, right greater than left. There is associated mucosal enhancement. Swelling of the supraglottic laryngeal mucosa present as well. Piriform sinuses are largely effaced. Vallecula is partially effaced due to swelling of the epiglottis  and aryepiglottic folds. No discrete abscess identified. Mild extension of inflammatory stranding laterally towards the carotid spaces, greater on the right. No retropharyngeal fluid collection. Findings most consistent with supraglottis. There is secondary narrowing of the supraglottic airway which is nearly effaced at its most narrow point near the level of the false cords. True cords themselves are grossly symmetric and within normal limits. The subglottic airway is clear. Scattered shotty subcentimeter cervical lymph nodes noted. No pathologically enlarged lymph nodes identified within the neck. Incidental note made of a 2.1 cm hypodense left thyroid nodule, indeterminate. Partially visualized lungs are clear. Normal intravascular enhancement seen within the neck. No acute osseous abnormality. Moderate degenerative spondylolysis present at C5-6 and C6-7 as well as at T1-2. No worrisome lytic or blastic osseous lesions. IMPRESSION: 1. Prominent swelling with edema involving the supraglottic larynx, including the epiglottis and aryepiglottic folds, right greater than left, most consistent with acute supraglottis. There is marked narrowing of the supraglottic airway which is nearly effaced near the level of the false cords. Clinical followup to resolution recommended. 2. 2.1 cm left thyroid nodule, indeterminate. Further evaluation with dedicated thyroid ultrasound recommended. This could be performed on a nonemergent basis. Results were called by telephone at the time of interpretation on 11/13/2015 at 3:27 am to Dr. Azalia Bilis , who verbally acknowledged these results. Electronically Signed   By: Rise Mu M.D.   On: 11/13/2015 03:44    Scheduled Meds: . ampicillin-sulbactam (UNASYN) IV  3 g Intravenous Q6H  . antiseptic oral rinse  7 mL Mouth Rinse BID  . dexamethasone  10 mg Intravenous 3 times per day  . doxycycline (VIBRAMYCIN) IV  100 mg Intravenous Q12H  . enoxaparin (LOVENOX) injection   40 mg Subcutaneous Q24H   Continuous Infusions:   Principal Problem:   Supraglottitis Active Problems:   Epiglottitis    Time spent: 25 min    Mckyle Solanki  [facility] Pager [phone] If 7PM-7AM, please contact night-coverage at [email or web address], password [facility] 11/14/2015, 6:39 PM  LOS: 1 day

## 2015-11-14 NOTE — Progress Notes (Signed)
Report given to [facility].  

## 2015-11-15 ENCOUNTER — Ambulatory Visit: Payer: Medicare Other

## 2015-11-15 MED ORDER — AMOXICILLIN-POT CLAVULANATE 875-125 MG PO TABS: 1.0000 | ORAL_TABLET | Freq: Two times a day (BID) | ORAL | Status: DC

## 2015-11-15 MED ORDER — DOXYCYCLINE HYCLATE 100 MG PO TABS
100.0000 mg | ORAL_TABLET | Freq: Two times a day (BID) | ORAL | Status: DC
  Administered 2015-11-15: 100 mg via ORAL
  Filled 2015-11-15: qty 1

## 2015-11-15 NOTE — Care Management Note (Signed)
Case Management Note  Patient Details  Name: Heather Lamb MRN: [id number] Date of Birth: 1946/02/26  Subjective/Objective:                  Independent patient admitted form home with husband for supraglottitis. Patient denies any assistance with medications or DME, has a PCP who is a family friend, and drives.   Action/Plan:  No CM needs identified at this time, anticipate DC today. Expected Discharge Date:                  Expected Discharge Plan:  Home/Self Care  In-House Referral:     Discharge planning Services  CM Consult  Post Acute Care Choice:    Choice offered to:     DME Arranged:    DME Agency:     HH Arranged:    HH Agency:     Status of Service:  Completed, signed off  Medicare Important Message Given:    Date Medicare IM Given:    Medicare IM give by:    Date Additional Medicare IM Given:    Additional Medicare Important Message give by:     If discussed at Long Length of Stay Meetings, dates discussed:    Additional Comments:  Heather SabalDebbie Omran Keelin, RN 11/15/2015, 11:58 AM

## 2015-11-15 NOTE — Discharge Summary (Signed)
Physician Discharge Summary  Heather Lamb RUE:[id number] DOB: 1946/08/25 DOA: 11/13/2015  PCP: Lorretta Harp, MD  Admit date: 11/13/2015 Discharge date: 11/15/2015  Time spent: 30 minutes  Recommendations for Outpatient Follow-up:  1. Follow up with ENT and PCP as recommended.    Discharge Diagnoses:  Principal Problem:   Supraglottitis Active Problems:   Epiglottitis   Discharge Condition: improved Diet recommendation: regular.  Filed Weights   11/13/15 0536 11/14/15 1828  Weight: 90.9 kg (200 lb 6.4 oz) 92.4 kg (203 lb 11.3 oz)    History of present illness:  Heather Lamb is a 69 y.o. female with no significant past medical history who presents with sore throat. SHE WAS found to have acute epiglottis.   Hospital Course:  1. ACUTE EPIGLOTTIS: - admitted to step down and started on IV decadron, IV antibiotics and supportive care. She was started on clears and advanced to regular diet.  - ENT consulted and recommendations given. her discomfort has resolved adn she will be discharged on oral antibiotics for 10 days asper ENT recommendations and outpatient follow up with ENT.    Procedures: NONE Consultations:  ENT  Discharge Exam: Filed Vitals:   11/14/15 1828 11/15/15 0503  BP: 152/73 142/86  Pulse: 95 67  Temp: 97.9 F (36.6 C) 97.5 F (36.4 C)  Resp: 18 18    General: alert and comfortable Cardiovascular: s1s2 Respiratory: ctab  Discharge Instructions   Discharge Instructions    Discharge instructions    Complete by:  As directed   Follow up with [facility] AS recommended.          Discharge Medication List as of 11/15/2015 11:57 AM    START taking these medications   Details  amoxicillin-clavulanate (AUGMENTIN) 875-125 MG tablet Take 1 tablet by mouth 2 (two) times daily., Starting 11/15/2015, Until Discontinued, Print      CONTINUE these medications which have NOT CHANGED   Details  calcium carbonate (OS-CAL) 600 MG TABS tablet  Take 600 mg by mouth 2 (two) times daily with a meal. With vit D3, Until Discontinued, Historical Med    Cholecalciferol (VITAMIN D PO) Take 1 tablet by mouth daily., Until Discontinued, Historical Med       Allergies  Allergen Reactions  . Sulfa Antibiotics Nausea And Vomiting   Follow-up Information    Follow up with SHOEMAKER, DAVID, MD. Schedule an appointment as soon as possible for a visit in 2 weeks.   Specialty:  Otolaryngology   Contact information:   [address] [place] [address] [phone]       Follow up with Lorretta Harp, MD. Schedule an appointment as soon as possible for a visit on 11/20/2015.   Specialties:  Internal Medicine, Pediatrics   Why:  Appointment with Dr. Fabian Sharp is on 11/20/15 at 10am   Contact information:   [address] [place] [address] [phone]        The results of significant diagnostics from this hospitalization (including imaging, microbiology, ancillary and laboratory) are listed below for reference.    Significant Diagnostic Studies: Ct Soft Tissue Neck W Contrast  11/13/2015  CLINICAL DATA:  Initial evaluation for acute sore throat with swelling, difficulty swallowing. EXAM: CT NECK WITH CONTRAST TECHNIQUE: Multidetector CT imaging of the neck was performed using the standard protocol following the bolus administration of intravenous contrast. CONTRAST:  75mL OMNIPAQUE IOHEXOL 300 MG/ML  SOLN COMPARISON:  None. FINDINGS: Visualized portions of the brain demonstrate no acute abnormality.  Visualized paranasal sinuses and mastoid air cells are clear. Salivary glands including the parotid glands and submandibular glands are within normal limits. Partially visualized oral cavity demonstrates no acute abnormality. Palatine tonsils mildly enlarged and hyper enhancing. The right tonsil is slightly enlarged as compared to the left. Scattered calcified tonsilliths present. Parapharyngeal fat preserved.  Nasopharynx within normal limits. Swelling with edema present within the epiglottis with extension along the aryepiglottic folds bilaterally, right greater than left. There is associated mucosal enhancement. Swelling of the supraglottic laryngeal mucosa present as well. Piriform sinuses are largely effaced. Vallecula is partially effaced due to swelling of the epiglottis and aryepiglottic folds. No discrete abscess identified. Mild extension of inflammatory stranding laterally towards the carotid spaces, greater on the right. No retropharyngeal fluid collection. Findings most consistent with supraglottis. There is secondary narrowing of the supraglottic airway which is nearly effaced at its most narrow point near the level of the false cords. True cords themselves are grossly symmetric and within normal limits. The subglottic airway is clear. Scattered shotty subcentimeter cervical lymph nodes noted. No pathologically enlarged lymph nodes identified within the neck. Incidental note made of a 2.1 cm hypodense left thyroid nodule, indeterminate. Partially visualized lungs are clear. Normal intravascular enhancement seen within the neck. No acute osseous abnormality. Moderate degenerative spondylolysis present at C5-6 and C6-7 as well as at T1-2. No worrisome lytic or blastic osseous lesions. IMPRESSION: 1. Prominent swelling with edema involving the supraglottic larynx, including the epiglottis and aryepiglottic folds, right greater than left, most consistent with acute supraglottis. There is marked narrowing of the supraglottic airway which is nearly effaced near the level of the false cords. Clinical followup to resolution recommended. 2. 2.1 cm left thyroid nodule, indeterminate. Further evaluation with dedicated thyroid ultrasound recommended. This could be performed on a nonemergent basis. Results were called by telephone at the time of interpretation on 11/13/2015 at 3:27 am to Dr. Azalia BilisKEVIN CAMPOS , who verbally  acknowledged these results. Electronically Signed   By: Rise MuBenjamin  McClintock M.D.   On: 11/13/2015 03:44    Microbiology: Recent Results (from the past 240 hour(s))  MRSA PCR Screening     Status: None   Collection Time: 11/13/15  5:31 AM  Result Value Ref Range Status   MRSA by PCR NEGATIVE NEGATIVE Final    Comment:        The GeneXpert MRSA Assay (FDA approved for NASAL specimens only), is one component of a comprehensive MRSA colonization surveillance program. It is not intended to diagnose MRSA infection nor to guide or monitor treatment for MRSA infections.      Labs: Basic Metabolic Panel:  Recent Labs Lab 11/13/15 0101 11/14/15 0403  NA 138 139  K 3.7 3.8  CL 107 108  CO2 24 23  GLUCOSE 159* 164*  BUN 12 10  CREATININE 0.72 0.52  CALCIUM 8.9 8.9   Liver Function Tests: No results for input(s): AST, ALT, ALKPHOS, BILITOT, PROT, ALBUMIN in the last 168 hours. No results for input(s): LIPASE, AMYLASE in the last 168 hours. No results for input(s): AMMONIA in the last 168 hours. CBC:  Recent Labs Lab 11/13/15 0101 11/14/15 0403  WBC 17.4* 19.8*  NEUTROABS 14.6*  --   HGB 14.8 13.5  HCT 43.8 39.7  MCV 83.3 83.4  PLT 199 209   Cardiac Enzymes: No results for input(s): CKTOTAL, CKMB, CKMBINDEX, TROPONINI in the last 168 hours. BNP: BNP (last 3 results) No results for input(s): BNP in the last 8760 hours.  ProBNP (last 3 results) No results for input(s): PROBNP in the last 8760 hours.  CBG: No results for input(s): GLUCAP in the last 168 hours.     SignedKathlen Mody  [facility] 11/15/2015, 6:52 PM

## 2015-11-15 NOTE — Progress Notes (Signed)
Catalina AntiguaJanice G Lenn to be D/C'd Home per MD order.  Discussed with the patient and all questions fully answered.  VSS, Skin clean, dry and intact without evidence of skin break down, no evidence of skin tears noted. IV catheter discontinued intact. Site without signs and symptoms of complications. Dressing and pressure applied.  An After Visit Summary was printed and given to the patient. Patient received prescription.  D/c education completed with patient/family including follow up instructions, medication list, d/c activities limitations if indicated, with other d/c instructions as indicated by MD - patient able to verbalize understanding, all questions fully answered.   Patient instructed to return to ED, call 911, or call MD for any changes in condition.   Patient escorted via WC, and D/C home via private auto.  Pura SpiceJessica K Edwards 11/15/2015 11:56 AM

## 2015-11-20 ENCOUNTER — Encounter: Payer: Medicare Other | Admitting: Internal Medicine

## 2015-11-20 NOTE — Progress Notes (Signed)
Document opened and reviewed for post hospital  For epiglottis   . No showed . But was to be follow up by ENT

## 2016-01-30 ENCOUNTER — Ambulatory Visit
Admission: RE | Admit: 2016-01-30 | Discharge: 2016-01-30 | Disposition: A | Discharge status: Home / Self Care | Payer: Medicare Other | Source: Ambulatory Visit

## 2016-01-30 DIAGNOSIS — Z1231 Encounter for screening mammogram for malignant neoplasm of breast: Secondary | ICD-10-CM

## 2016-03-24 LAB — FECAL OCCULT BLOOD, GUAIAC: FECAL OCCULT BLD: NEGATIVE

## 2016-04-11 ENCOUNTER — Encounter: Payer: Self-pay | Admitting: Family Medicine

## 2016-04-23 ENCOUNTER — Other Ambulatory Visit (INDEPENDENT_AMBULATORY_CARE_PROVIDER_SITE_OTHER): Payer: Medicare Other

## 2016-04-23 DIAGNOSIS — Z0000 Encounter for general adult medical examination without abnormal findings: Secondary | ICD-10-CM | POA: Diagnosis not present

## 2016-04-23 LAB — BASIC METABOLIC PANEL
BUN: 17 mg/dL (ref 6–23)
CHLORIDE: 104 meq/L (ref 96–112)
CO2: 29 mEq/L (ref 19–32)
CREATININE: 0.59 mg/dL (ref 0.40–1.20)
Calcium: 9.5 mg/dL (ref 8.4–10.5)
GFR: 107.92 mL/min (ref >60.00)
GLUCOSE: 91 mg/dL (ref 70–99)
POTASSIUM: 4.2 meq/L (ref 3.5–5.1)
Sodium: 140 mEq/L (ref 135–145)

## 2016-04-23 LAB — HEPATIC FUNCTION PANEL
ALBUMIN: 4.5 g/dL (ref 3.5–5.2)
ALT: 33 U/L (ref 0–35)
AST: 23 U/L (ref 0–37)
Alkaline Phosphatase: 67 U/L (ref 39–117)
Bilirubin, Direct: 0.1 mg/dL (ref 0.0–0.3)
TOTAL PROTEIN: 6.7 g/dL (ref 6.0–8.3)
Total Bilirubin: 0.6 mg/dL (ref 0.2–1.2)

## 2016-04-23 LAB — CBC WITH DIFFERENTIAL/PLATELET
BASOS PCT: 0.5 % (ref 0.0–3.0)
Basophils Absolute: 0 10*3/uL (ref 0.0–0.1)
EOS ABS: 0.1 10*3/uL (ref 0.0–0.7)
EOS PCT: 0.9 % (ref 0.0–5.0)
HCT: 45.3 % (ref 36.0–46.0)
Hemoglobin: 15.4 g/dL — ABNORMAL HIGH (ref 12.0–15.0)
LYMPHS ABS: 2.3 10*3/uL (ref 0.7–4.0)
Lymphocytes Relative: 42.9 % (ref 12.0–46.0)
MCHC: 34 g/dL (ref 30.0–36.0)
MCV: 82 fl (ref 78.0–100.0)
MONO ABS: 0.5 10*3/uL (ref 0.1–1.0)
Monocytes Relative: 8.6 % (ref 3.0–12.0)
NEUTROS PCT: 47.1 % (ref 43.0–77.0)
Neutro Abs: 2.5 10*3/uL (ref 1.4–7.7)
PLATELETS: 225 10*3/uL (ref 150.0–400.0)
RBC: 5.53 Mil/uL — ABNORMAL HIGH (ref 3.87–5.11)
RDW: 13.6 % (ref 11.5–15.5)
WBC: 5.3 10*3/uL (ref 4.0–10.5)

## 2016-04-23 LAB — LIPID PANEL
CHOL/HDL RATIO: 6
CHOLESTEROL: 232 mg/dL — AB (ref 0–200)
HDL: 39.5 mg/dL (ref >39.00)
LDL Cholesterol: 167 mg/dL — ABNORMAL HIGH (ref 0–99)
NonHDL: 192.84
TRIGLYCERIDES: 128 mg/dL (ref 0.0–149.0)
VLDL: 25.6 mg/dL (ref 0.0–40.0)

## 2016-04-23 LAB — TSH: TSH: 1.7 u[IU]/mL (ref 0.35–4.50)

## 2016-04-29 NOTE — Patient Instructions (Addendum)
Intensify lifestyle interventions. As you plan      Sugar so much better   Recheck cholesterol and hep c screen in 3-4 months .  No obv needed. Get bone density .    As planned   [facility].    Preventive Care for Adults, Female A healthy lifestyle and preventive care can promote health and wellness. Preventive health guidelines for women include the following key practices.  A routine yearly physical is a good way to check with your health care provider about your health and preventive screening. It is a chance to share any concerns and updates on your health and to receive a thorough exam.  Visit your dentist for a routine exam and preventive care every 6 months. Brush your teeth twice a day and floss once a day. Good oral hygiene prevents tooth decay and gum disease.  The frequency of eye exams is based on your age, health, family medical history, use of contact lenses, and other factors. Follow your health care provider's recommendations for frequency of eye exams.  Eat a healthy diet. Foods like vegetables, fruits, whole grains, low-fat dairy products, and lean protein foods contain the nutrients you need without too many calories. Decrease your intake of foods high in solid fats, added sugars, and salt. Eat the right amount of calories for you.Get information about a proper diet from your health care provider, if necessary.  Regular physical exercise is one of the most important things you can do for your health. Most adults should get at least 150 minutes of moderate-intensity exercise (any activity that increases your heart rate and causes you to sweat) each week. In addition, most adults need muscle-strengthening exercises on 2 or more days a week.  Maintain a healthy weight. The body mass index (BMI) is a screening tool to identify possible weight problems. It provides an estimate of body fat based on height and weight. Your health care provider can find your BMI and can help you  achieve or maintain a healthy weight.For adults 20 years and older:  A BMI below 18.5 is considered underweight.  A BMI of 18.5 to 24.9 is normal.  A BMI of 25 to 29.9 is considered overweight.  A BMI of 30 and above is considered obese.  Maintain normal blood lipids and cholesterol levels by exercising and minimizing your intake of saturated fat. Eat a balanced diet with plenty of fruit and vegetables. Blood tests for lipids and cholesterol should begin at age 86 and be repeated every 5 years. If your lipid or cholesterol levels are high, you are over 50, or you are at high risk for heart disease, you may need your cholesterol levels checked more frequently.Ongoing high lipid and cholesterol levels should be treated with medicines if diet and exercise are not working.  If you smoke, find out from your health care provider how to quit. If you do not use tobacco, do not start.  Lung cancer screening is recommended for adults aged 31-80 years who are at high risk for developing lung cancer because of a history of smoking. A yearly low-dose CT scan of the lungs is recommended for people who have at least a 30-pack-year history of smoking and are a current smoker or have quit within the past 15 years. A pack year of smoking is smoking an average of 1 pack of cigarettes a day for 1 year (for example: 1 pack a day for 30 years or 2 packs a day for 15 years). Yearly  screening should continue until the smoker has stopped smoking for at least 15 years. Yearly screening should be stopped for people who develop a health problem that would prevent them from having lung cancer treatment.  If you are pregnant, do not drink alcohol. If you are breastfeeding, be very cautious about drinking alcohol. If you are not pregnant and choose to drink alcohol, do not have more than 1 drink per day. One drink is considered to be 12 ounces (355 mL) of beer, 5 ounces (148 mL) of wine, or 1.5 ounces (44 mL) of liquor.  Avoid  use of street drugs. Do not share needles with anyone. Ask for help if you need support or instructions about stopping the use of drugs.  High blood pressure causes heart disease and increases the risk of stroke. Your blood pressure should be checked at least every 1 to 2 years. Ongoing high blood pressure should be treated with medicines if weight loss and exercise do not work.  If you are 75-26 years old, ask your health care provider if you should take aspirin to prevent strokes.  Diabetes screening is done by taking a blood sample to check your blood glucose level after you have not eaten for a certain period of time (fasting). If you are not overweight and you do not have risk factors for diabetes, you should be screened once every 3 years starting at age 61. If you are overweight or obese and you are 67-76 years of age, you should be screened for diabetes every year as part of your cardiovascular risk assessment.  Breast cancer screening is essential preventive care for women. You should practice "breast self-awareness." This means understanding the normal appearance and feel of your breasts and may include breast self-examination. Any changes detected, no matter how small, should be reported to a health care provider. Women in their 52s and 30s should have a clinical breast exam (CBE) by a health care provider as part of a regular health exam every 1 to 3 years. After age 26, women should have a CBE every year. Starting at age 9, women should consider having a mammogram (breast X-ray test) every year. Women who have a family history of breast cancer should talk to their health care provider about genetic screening. Women at a high risk of breast cancer should talk to their health care providers about having an MRI and a mammogram every year.  Breast cancer gene (BRCA)-related cancer risk assessment is recommended for women who have family members with BRCA-related cancers. BRCA-related cancers  include breast, ovarian, tubal, and peritoneal cancers. Having family members with these cancers may be associated with an increased risk for harmful changes (mutations) in the breast cancer genes BRCA1 and BRCA2. Results of the assessment will determine the need for genetic counseling and BRCA1 and BRCA2 testing.  Your health care provider may recommend that you be screened regularly for cancer of the pelvic organs (ovaries, uterus, and vagina). This screening involves a pelvic examination, including checking for microscopic changes to the surface of your cervix (Pap test). You may be encouraged to have this screening done every 3 years, beginning at age 67.  For women ages 8-65, health care providers may recommend pelvic exams and Pap testing every 3 years, or they may recommend the Pap and pelvic exam, combined with testing for human papilloma virus (HPV), every 5 years. Some types of HPV increase your risk of cervical cancer. Testing for HPV may also be done on women of  any age with unclear Pap test results.  Other health care providers may not recommend any screening for nonpregnant women who are considered low risk for pelvic cancer and who do not have symptoms. Ask your health care provider if a screening pelvic exam is right for you.  If you have had past treatment for cervical cancer or a condition that could lead to cancer, you need Pap tests and screening for cancer for at least 20 years after your treatment. If Pap tests have been discontinued, your risk factors (such as having a new sexual partner) need to be reassessed to determine if screening should resume. Some women have medical problems that increase the chance of getting cervical cancer. In these cases, your health care provider may recommend more frequent screening and Pap tests.  Colorectal cancer can be detected and often prevented. Most routine colorectal cancer screening begins at the age of 61 years and continues through age 84  years. However, your health care provider may recommend screening at an earlier age if you have risk factors for colon cancer. On a yearly basis, your health care provider may provide home test kits to check for hidden blood in the stool. Use of a small camera at the end of a tube, to directly examine the colon (sigmoidoscopy or colonoscopy), can detect the earliest forms of colorectal cancer. Talk to your health care provider about this at age 44, when routine screening begins. Direct exam of the colon should be repeated every 5-10 years through age 41 years, unless early forms of precancerous polyps or small growths are found.  People who are at an increased risk for hepatitis B should be screened for this virus. You are considered at high risk for hepatitis B if:  You were born in a country where hepatitis B occurs often. Talk with your health care provider about which countries are considered high risk.  Your parents were born in a high-risk country and you have not received a shot to protect against hepatitis B (hepatitis B vaccine).  You have HIV or AIDS.  You use needles to inject street drugs.  You live with, or have sex with, someone who has hepatitis B.  You get hemodialysis treatment.  You take certain medicines for conditions like cancer, organ transplantation, and autoimmune conditions.  Hepatitis C blood testing is recommended for all people born from [birth year] through 1965 and any individual with known risks for hepatitis C.  Practice safe sex. Use condoms and avoid high-risk sexual practices to reduce the spread of sexually transmitted infections (STIs). STIs include gonorrhea, chlamydia, syphilis, trichomonas, herpes, HPV, and human immunodeficiency virus (HIV). Herpes, HIV, and HPV are viral illnesses that have no cure. They can result in disability, cancer, and death.  You should be screened for sexually transmitted illnesses (STIs) including gonorrhea and chlamydia if:  You  are sexually active and are younger than 24 years.  You are older than 24 years and your health care provider tells you that you are at risk for this type of infection.  Your sexual activity has changed since you were last screened and you are at an increased risk for chlamydia or gonorrhea. Ask your health care provider if you are at risk.  If you are at risk of being infected with HIV, it is recommended that you take a prescription medicine daily to prevent HIV infection. This is called preexposure prophylaxis (PrEP). You are considered at risk if:  You are sexually active and do not regularly  use condoms or know the HIV status of your partner(s).  You take drugs by injection.  You are sexually active with a partner who has HIV.  Talk with your health care provider about whether you are at high risk of being infected with HIV. If you choose to begin PrEP, you should first be tested for HIV. You should then be tested every 3 months for as long as you are taking PrEP.  Osteoporosis is a disease in which the bones lose minerals and strength with aging. This can result in serious bone fractures or breaks. The risk of osteoporosis can be identified using a bone density scan. Women ages 71 years and over and women at risk for fractures or osteoporosis should discuss screening with their health care providers. Ask your health care provider whether you should take a calcium supplement or vitamin D to reduce the rate of osteoporosis.  Menopause can be associated with physical symptoms and risks. Hormone replacement therapy is available to decrease symptoms and risks. You should talk to your health care provider about whether hormone replacement therapy is right for you.  Use sunscreen. Apply sunscreen liberally and repeatedly throughout the day. You should seek shade when your shadow is shorter than you. Protect yourself by wearing long sleeves, pants, a wide-brimmed hat, and sunglasses year round,  whenever you are outdoors.  Once a month, do a whole body skin exam, using a mirror to look at the skin on your back. Tell your health care provider of new moles, moles that have irregular borders, moles that are larger than a pencil eraser, or moles that have changed in shape or color.  Stay current with required vaccines (immunizations).  Influenza vaccine. All adults should be immunized every year.  Tetanus, diphtheria, and acellular pertussis (Td, Tdap) vaccine. Pregnant women should receive 1 dose of Tdap vaccine during each pregnancy. The dose should be obtained regardless of the length of time since the last dose. Immunization is preferred during the 27th-36th week of gestation. An adult who has not previously received Tdap or who does not know her vaccine status should receive 1 dose of Tdap. This initial dose should be followed by tetanus and diphtheria toxoids (Td) booster doses every 10 years. Adults with an unknown or incomplete history of completing a 3-dose immunization series with Td-containing vaccines should begin or complete a primary immunization series including a Tdap dose. Adults should receive a Td booster every 10 years.  Varicella vaccine. An adult without evidence of immunity to varicella should receive 2 doses or a second dose if she has previously received 1 dose. Pregnant females who do not have evidence of immunity should receive the first dose after pregnancy. This first dose should be obtained before leaving the health care facility. The second dose should be obtained 4-8 weeks after the first dose.  Human papillomavirus (HPV) vaccine. Females aged 13-26 years who have not received the vaccine previously should obtain the 3-dose series. The vaccine is not recommended for use in pregnant females. However, pregnancy testing is not needed before receiving a dose. If a female is found to be pregnant after receiving a dose, no treatment is needed. In that case, the remaining  doses should be delayed until after the pregnancy. Immunization is recommended for any person with an immunocompromised condition through the age of 79 years if she did not get any or all doses earlier. During the 3-dose series, the second dose should be obtained 4-8 weeks after the first dose.  The third dose should be obtained 24 weeks after the first dose and 16 weeks after the second dose.  Zoster vaccine. One dose is recommended for adults aged 63 years or older unless certain conditions are present.  Measles, mumps, and rubella (MMR) vaccine. Adults born before [birth year] generally are considered immune to measles and mumps. Adults born in [birth year] or later should have 1 or more doses of MMR vaccine unless there is a contraindication to the vaccine or there is laboratory evidence of immunity to each of the three diseases. A routine second dose of MMR vaccine should be obtained at least 28 days after the first dose for students attending postsecondary schools, health care workers, or international travelers. People who received inactivated measles vaccine or an unknown type of measles vaccine during 1963-1967 should receive 2 doses of MMR vaccine. People who received inactivated mumps vaccine or an unknown type of mumps vaccine before 1979 and are at high risk for mumps infection should consider immunization with 2 doses of MMR vaccine. For females of childbearing age, rubella immunity should be determined. If there is no evidence of immunity, females who are not pregnant should be vaccinated. If there is no evidence of immunity, females who are pregnant should delay immunization until after pregnancy. Unvaccinated health care workers born before [birth year] who lack laboratory evidence of measles, mumps, or rubella immunity or laboratory confirmation of disease should consider measles and mumps immunization with 2 doses of MMR vaccine or rubella immunization with 1 dose of MMR vaccine.  Pneumococcal 13-valent conjugate  (PCV13) vaccine. When indicated, a person who is uncertain of his immunization history and has no record of immunization should receive the PCV13 vaccine. All adults 66 years of age and older should receive this vaccine. An adult aged 8 years or older who has certain medical conditions and has not been previously immunized should receive 1 dose of PCV13 vaccine. This PCV13 should be followed with a dose of pneumococcal polysaccharide (PPSV23) vaccine. Adults who are at high risk for pneumococcal disease should obtain the PPSV23 vaccine at least 8 weeks after the dose of PCV13 vaccine. Adults older than 68 years of age who have normal immune system function should obtain the PPSV23 vaccine dose at least 1 year after the dose of PCV13 vaccine.  Pneumococcal polysaccharide (PPSV23) vaccine. When PCV13 is also indicated, PCV13 should be obtained first. All adults aged 26 years and older should be immunized. An adult younger than age 32 years who has certain medical conditions should be immunized. Any person who resides in a nursing home or long-term care facility should be immunized. An adult smoker should be immunized. People with an immunocompromised condition and certain other conditions should receive both PCV13 and PPSV23 vaccines. People with human immunodeficiency virus (HIV) infection should be immunized as soon as possible after diagnosis. Immunization during chemotherapy or radiation therapy should be avoided. Routine use of PPSV23 vaccine is not recommended for American Indians, [place] Natives, or people younger than 65 years unless there are medical conditions that require PPSV23 vaccine. When indicated, people who have unknown immunization and have no record of immunization should receive PPSV23 vaccine. One-time revaccination 5 years after the first dose of PPSV23 is recommended for people aged 19-64 years who have chronic kidney failure, nephrotic syndrome, asplenia, or immunocompromised conditions.  People who received 1-2 doses of PPSV23 before age 89 years should receive another dose of PPSV23 vaccine at age 38 years or later if at least 5 years have passed  since the previous dose. Doses of PPSV23 are not needed for people immunized with PPSV23 at or after age 5 years.  Meningococcal vaccine. Adults with asplenia or persistent complement component deficiencies should receive 2 doses of quadrivalent meningococcal conjugate (MenACWY-D) vaccine. The doses should be obtained at least 2 months apart. Microbiologists working with certain meningococcal bacteria, [employer] recruits, people at risk during an outbreak, and people who travel to or live in countries with a high rate of meningitis should be immunized. A first-year college student up through age 47 years who is living in a residence hall should receive a dose if she did not receive a dose on or after her 16th birthday. Adults who have certain high-risk conditions should receive one or more doses of vaccine.  Hepatitis A vaccine. Adults who wish to be protected from this disease, have certain high-risk conditions, work with hepatitis A-infected animals, work in hepatitis A research labs, or travel to or work in countries with a high rate of hepatitis A should be immunized. Adults who were previously unvaccinated and who anticipate close contact with an international adoptee during the first 60 days after arrival in the [place] from a country with a high rate of hepatitis A should be immunized.  Hepatitis B vaccine. Adults who wish to be protected from this disease, have certain high-risk conditions, may be exposed to blood or other infectious body fluids, are household contacts or sex partners of hepatitis B positive people, are clients or workers in certain care facilities, or travel to or work in countries with a high rate of hepatitis B should be immunized.  Haemophilus influenzae type b (Hib) vaccine. A previously unvaccinated person with  asplenia or sickle cell disease or having a scheduled splenectomy should receive 1 dose of Hib vaccine. Regardless of previous immunization, a recipient of a hematopoietic stem cell transplant should receive a 3-dose series 6-12 months after her successful transplant. Hib vaccine is not recommended for adults with HIV infection. Preventive Services / Frequency Ages 24 to 50 years  Blood pressure check.** / Every 3-5 years.  Lipid and cholesterol check.** / Every 5 years beginning at age 64.  Clinical breast exam.** / Every 3 years for women in their 37s and 48s.  BRCA-related cancer risk assessment.** / For women who have family members with a BRCA-related cancer (breast, ovarian, tubal, or peritoneal cancers).  Pap test.** / Every 2 years from ages 54 through 59. Every 3 years starting at age 39 through age 67 or 109 with a history of 3 consecutive normal Pap tests.  HPV screening.** / Every 3 years from ages 56 through ages 1 to 55 with a history of 3 consecutive normal Pap tests.  Hepatitis C blood test.** / For any individual with known risks for hepatitis C.  Skin self-exam. / Monthly.  Influenza vaccine. / Every year.  Tetanus, diphtheria, and acellular pertussis (Tdap, Td) vaccine.** / Consult your health care provider. Pregnant women should receive 1 dose of Tdap vaccine during each pregnancy. 1 dose of Td every 10 years.  Varicella vaccine.** / Consult your health care provider. Pregnant females who do not have evidence of immunity should receive the first dose after pregnancy.  HPV vaccine. / 3 doses over 6 months, if 6 and younger. The vaccine is not recommended for use in pregnant females. However, pregnancy testing is not needed before receiving a dose.  Measles, mumps, rubella (MMR) vaccine.** / You need at least 1 dose of MMR if you were  born in [birth year] or later. You may also need a 2nd dose. For females of childbearing age, rubella immunity should be determined. If there is  no evidence of immunity, females who are not pregnant should be vaccinated. If there is no evidence of immunity, females who are pregnant should delay immunization until after pregnancy.  Pneumococcal 13-valent conjugate (PCV13) vaccine.** / Consult your health care provider.  Pneumococcal polysaccharide (PPSV23) vaccine.** / 1 to 2 doses if you smoke cigarettes or if you have certain conditions.  Meningococcal vaccine.** / 1 dose if you are age 65 to 76 years and a Market researcher living in a residence hall, or have one of several medical conditions, you need to get vaccinated against meningococcal disease. You may also need additional booster doses.  Hepatitis A vaccine.** / Consult your health care provider.  Hepatitis B vaccine.** / Consult your health care provider.  Haemophilus influenzae type b (Hib) vaccine.** / Consult your health care provider. Ages 19 to 77 years  Blood pressure check.** / Every year.  Lipid and cholesterol check.** / Every 5 years beginning at age 51 years.  Lung cancer screening. / Every year if you are aged 97-80 years and have a 30-pack-year history of smoking and currently smoke or have quit within the past 15 years. Yearly screening is stopped once you have quit smoking for at least 15 years or develop a health problem that would prevent you from having lung cancer treatment.  Clinical breast exam.** / Every year after age 39 years.  BRCA-related cancer risk assessment.** / For women who have family members with a BRCA-related cancer (breast, ovarian, tubal, or peritoneal cancers).  Mammogram.** / Every year beginning at age 4 years and continuing for as long as you are in good health. Consult with your health care provider.  Pap test.** / Every 3 years starting at age 83 years through age 44 or 24 years with a history of 3 consecutive normal Pap tests.  HPV screening.** / Every 3 years from ages 42 years through ages 50 to 30 years with a  history of 3 consecutive normal Pap tests.  Fecal occult blood test (FOBT) of stool. / Every year beginning at age 47 years and continuing until age 15 years. You may not need to do this test if you get a colonoscopy every 10 years.  Flexible sigmoidoscopy or colonoscopy.** / Every 5 years for a flexible sigmoidoscopy or every 10 years for a colonoscopy beginning at age 63 years and continuing until age 100 years.  Hepatitis C blood test.** / For all people born from [birth year] through 1963 and any individual with known risks for hepatitis C.  Skin self-exam. / Monthly.  Influenza vaccine. / Every year.  Tetanus, diphtheria, and acellular pertussis (Tdap/Td) vaccine.** / Consult your health care provider. Pregnant women should receive 1 dose of Tdap vaccine during each pregnancy. 1 dose of Td every 10 years.  Varicella vaccine.** / Consult your health care provider. Pregnant females who do not have evidence of immunity should receive the first dose after pregnancy.  Zoster vaccine.** / 1 dose for adults aged 21 years or older.  Measles, mumps, rubella (MMR) vaccine.** / You need at least 1 dose of MMR if you were born in 1957 or later. You may also need a second dose. For females of childbearing age, rubella immunity should be determined. If there is no evidence of immunity, females who are not pregnant should be vaccinated. If there is no evidence of  immunity, females who are pregnant should delay immunization until after pregnancy.  Pneumococcal 13-valent conjugate (PCV13) vaccine.** / Consult your health care provider.  Pneumococcal polysaccharide (PPSV23) vaccine.** / 1 to 2 doses if you smoke cigarettes or if you have certain conditions.  Meningococcal vaccine.** / Consult your health care provider.  Hepatitis A vaccine.** / Consult your health care provider.  Hepatitis B vaccine.** / Consult your health care provider.  Haemophilus influenzae type b (Hib) vaccine.** / Consult your health  care provider. Ages 54 years and over  Blood pressure check.** / Every year.  Lipid and cholesterol check.** / Every 5 years beginning at age 37 years.  Lung cancer screening. / Every year if you are aged 60-80 years and have a 30-pack-year history of smoking and currently smoke or have quit within the past 15 years. Yearly screening is stopped once you have quit smoking for at least 15 years or develop a health problem that would prevent you from having lung cancer treatment.  Clinical breast exam.** / Every year after age 4 years.  BRCA-related cancer risk assessment.** / For women who have family members with a BRCA-related cancer (breast, ovarian, tubal, or peritoneal cancers).  Mammogram.** / Every year beginning at age 44 years and continuing for as long as you are in good health. Consult with your health care provider.  Pap test.** / Every 3 years starting at age 47 years through age 37 or 34 years with 3 consecutive normal Pap tests. Testing can be stopped between 65 and 70 years with 3 consecutive normal Pap tests and no abnormal Pap or HPV tests in the past 10 years.  HPV screening.** / Every 3 years from ages 44 years through ages 66 or 72 years with a history of 3 consecutive normal Pap tests. Testing can be stopped between 65 and 70 years with 3 consecutive normal Pap tests and no abnormal Pap or HPV tests in the past 10 years.  Fecal occult blood test (FOBT) of stool. / Every year beginning at age 26 years and continuing until age 50 years. You may not need to do this test if you get a colonoscopy every 10 years.  Flexible sigmoidoscopy or colonoscopy.** / Every 5 years for a flexible sigmoidoscopy or every 10 years for a colonoscopy beginning at age 42 years and continuing until age 30 years.  Hepatitis C blood test.** / For all people born from [birth year] through 1963 and any individual with known risks for hepatitis C.  Osteoporosis screening.** / A one-time screening for women  ages 52 years and over and women at risk for fractures or osteoporosis.  Skin self-exam. / Monthly.  Influenza vaccine. / Every year.  Tetanus, diphtheria, and acellular pertussis (Tdap/Td) vaccine.** / 1 dose of Td every 10 years.  Varicella vaccine.** / Consult your health care provider.  Zoster vaccine.** / 1 dose for adults aged 32 years or older.  Pneumococcal 13-valent conjugate (PCV13) vaccine.** / Consult your health care provider.  Pneumococcal polysaccharide (PPSV23) vaccine.** / 1 dose for all adults aged 53 years and older.  Meningococcal vaccine.** / Consult your health care provider.  Hepatitis A vaccine.** / Consult your health care provider.  Hepatitis B vaccine.** / Consult your health care provider.  Haemophilus influenzae type b (Hib) vaccine.** / Consult your health care provider. ** Family history and personal history of risk and conditions may change your health care provider's recommendations.   This information is not intended to replace advice given to you by  your health care provider. Make sure you discuss any questions you have with your health care provider.   Document Released: 01/20/2002 Document Revised: 12/15/2014 Document Reviewed: 04/21/2011 Elsevier Interactive Patient Education [employer].        Why follow it? Research shows. Those who follow the Mediterranean diet have a reduced risk of heart disease  The diet is associated with a reduced incidence of Parkinson's and Alzheimer's diseases People following the diet may have longer life expectancies and lower rates of chronic diseases  The Dietary Guidelines for Americans recommends the Mediterranean diet as an eating plan to promote health and prevent disease  What Is the Mediterranean Diet?  Healthy eating plan based on typical foods and recipes of Mediterranean-style cooking The diet is primarily a plant based diet; these foods should make up a majority of meals   Starches -  Plant based foods should make up a majority of meals - They are an important sources of vitamins, minerals, energy, antioxidants, and fiber - Choose whole grains, foods high in fiber and minimally processed items  - Typical grain sources include wheat, oats, barley, corn, brown rice, bulgar, farro, millet, polenta, couscous  - Various types of beans include chickpeas, lentils, fava beans, black beans, white beans   Fruits  Veggies - Large quantities of antioxidant rich fruits & veggies; 6 or more servings  - Vegetables can be eaten raw or lightly drizzled with oil and cooked  - Vegetables common to the traditional Mediterranean Diet include: artichokes, arugula, beets, broccoli, brussel sprouts, cabbage, carrots, celery, collard greens, cucumbers, eggplant, kale, leeks, lemons, lettuce, mushrooms, okra, onions, peas, peppers, potatoes, pumpkin, radishes, rutabaga, shallots, spinach, sweet potatoes, turnips, zucchini - Fruits common to the Mediterranean Diet include: apples, apricots, avocados, cherries, clementines, dates, figs, grapefruits, grapes, melons, nectarines, oranges, peaches, pears, pomegranates, strawberries, tangerines  Fats - Replace butter and margarine with healthy oils, such as olive oil, canola oil, and tahini  - Limit nuts to no more than a handful a day  - Nuts include walnuts, almonds, pecans, pistachios, pine nuts  - Limit or avoid candied, honey roasted or heavily salted nuts - Olives are central to the [employer] - can be eaten whole or used in a variety of dishes   Meats Protein - Limiting red meat: no more than a few times a month - When eating red meat: choose lean cuts and keep the portion to the size of deck of cards - Eggs: approx. 0 to 4 times a week  - Fish and lean poultry: at least 2 a week  - Healthy protein sources include, chicken, [place], lean beef, lamb - Increase intake of seafood such as tuna, salmon, trout, mackerel, shrimp, scallops - Avoid or  limit high fat processed meats such as sausage and bacon  Dairy - Include moderate amounts of low fat dairy products  - Focus on healthy dairy such as fat free yogurt, skim milk, low or reduced fat cheese - Limit dairy products higher in fat such as whole or 2% milk, cheese, ice cream  Alcohol - Moderate amounts of red wine is ok  - No more than 5 oz daily for women (all ages) and men older than age 74  - No more than 10 oz of wine daily for men younger than 54  Other - Limit sweets and other desserts  - Use herbs and spices instead of salt to flavor foods  - Herbs and spices common to the traditional Mediterranean Diet  include: basil, bay leaves, chives, cloves, cumin, fennel, garlic, lavender, marjoram, mint, oregano, parsley, pepper, rosemary, sage, savory, sumac, tarragon, thyme   It's not just a diet, it's a lifestyle:  The Mediterranean diet includes lifestyle factors typical of those in the region  Foods, drinks and meals are best eaten with others and savored Daily physical activity is important for overall good health This could be strenuous exercise like running and aerobics This could also be more leisurely activities such as walking, housework, yard-work, or taking the stairs Moderation is the key; a balanced and healthy diet accommodates most foods and drinks Consider portion sizes and frequency of consumption of certain foods   Meal Ideas & Options:  Breakfast:  Whole wheat toast or whole wheat English muffins with peanut butter & hard boiled egg Steel cut oats topped with apples & cinnamon and skim milk  Fresh fruit: banana, strawberries, melon, berries, peaches  Smoothies: strawberries, bananas, greek yogurt, peanut butter Low fat greek yogurt with blueberries and granola  Egg white omelet with spinach and mushrooms Breakfast couscous: whole wheat couscous, apricots, skim milk, cranberries  Sandwiches:  Hummus and grilled vegetables (peppers, zucchini, squash) on whole  wheat bread   Grilled chicken on whole wheat pita with lettuce, tomatoes, cucumbers or tzatziki  [place] salad on whole wheat bread: tuna salad made with greek yogurt, olives, red peppers, capers, green onions Garlic rosemary lamb pita: lamb sauted with garlic, rosemary, salt & pepper; add lettuce, cucumber, greek yogurt to pita - flavor with lemon juice and black pepper  Seafood:  Mediterranean grilled salmon, seasoned with garlic, basil, parsley, lemon juice and black pepper Shrimp, lemon, and spinach whole-grain pasta salad made with low fat greek yogurt  Seared scallops with lemon orzo  Seared tuna steaks seasoned salt, pepper, coriander topped with tomato mixture of olives, tomatoes, olive oil, minced garlic, parsley, green onions and cappers  Meats:  Herbed greek chicken salad with kalamata olives, cucumber, feta  Red bell peppers stuffed with spinach, bulgur, lean ground beef (or lentils) & topped with feta   Kebabs: skewers of chicken, tomatoes, onions, zucchini, squash  [place] burgers: made with red onions, mint, dill, lemon juice, feta cheese topped with roasted red peppers Vegetarian Cucumber salad: cucumbers, artichoke hearts, celery, red onion, feta cheese, tossed in olive oil & lemon juice  Hummus and whole grain pita points with a greek salad (lettuce, tomato, feta, olives, cucumbers, red onion) Lentil soup with celery, carrots made with vegetable broth, garlic, salt and pepper  Tabouli salad: parsley, bulgur, mint, scallions, cucumbers, tomato, radishes, lemon juice, olive oil, salt and pepper.

## 2016-04-29 NOTE — Progress Notes (Signed)
Chief Complaint  Patient presents with  . Medicare Wellness    exam      HPI: MOMOKA STRINGFIELD 70 y.o. comes in today for Preventive Medicare wellness visit  And check up  .Since last visit.  exercising  over [place]  In the last  4 months   And  Less sugar high protein hard to lose weight feels strong some frustration  Had hosp for supra epi glottic infection  And in ice this winter  . No residual. No use of anxiety meds but has iit just in case  Was doing a low carb high protein diet   Including cream in coffee .  r ear huts and itches at times   [facility] today   Health Maintenance  Topic Date Due  . Hepatitis C Screening  1946/06/15  . INFLUENZA VACCINE  07/08/2016  . COLONOSCOPY  10/09/2016  . TETANUS/TDAP  10/09/2016  . COLON CANCER SCREENING ANNUAL FOBT  03/24/2017  . MAMMOGRAM  01/29/2018  . DEXA SCAN  Completed  . ZOSTAVAX  Completed  . PNA vac Low Risk Adult  Completed   Health Maintenance Review LIFESTYLE:  TAD  Neg tob   D    Sugar beverages: no Sleep: dec last night  Exercise :  Reg  Walk gym etc  See above weights and exercise     Hearing:  Ok r sometimes  hurts   Check it   Vision:  No limitations at present . Last eye check UTD  Safety:  Has smoke detector and wears seat belts.  No firearms. No excess sun exposure. Sees dentist regularly.  Falls:    no  Advance directive :  Reviewed    Memory: Felt to be good  , no concern from her or her family.  Depression: No anhedonia unusual crying or depressive symptoms  Nutrition: Eats well balanced diet; adequate calcium and vitamin D. No swallowing chewing problems.  Injury: no major injuries in the last six months.   Other healthcare providers:  Reviewed today .  Social:  Lives with spouse married. Dogs  pets.   Preventive parameters: up-to-date  Reviewed   ADLS:   There are no problems or need for assistance  driving, feeding, obtaining food, dressing, toileting and bathing, managing money using  phone. She is independent.    ROS:  GEN/ HEENT: No fever, significant weight changes sweats headaches vision problems hearing changes, CV/ PULM; No chest pain shortness of breath cough, syncope,edema  change in exercise tolerance. GI /GU: No adominal pain, vomiting, change in bowel habits. No blood in the stool. No significant GU symptoms. SKIN/HEME: ,no acute skin rashes suspicious lesions or bleeding. No lymphadenopathy, nodules, masses.  NEURO/ PSYCH:  No neurologic signs such as weakness numbness. No depression anxiety. IMM/ Allergy: No unusual infections.  Allergy .   REST of 12 system review negative except as per HPI   Past Medical History  Diagnosis Date  . History of UTI   . Right lower lobe pneumonia 10/11/2011  . Kidney stones   . Anxiety   . Allergy   . Panic attacks 08/23/2014  . Supraglottitis 11/13/2015  . Epiglottitis 11/13/2015    Family History  Problem Relation Age of Onset  . Arthritis Father   . Heart disease Father   . Hypertension      parent  . Stroke    . Hyperlipidemia      Social History   Social History  . Marital Status:  Married    Spouse Name: N/A  . Number of Children: N/A  . Years of Education: N/A   Social History Main Topics  . Smoking status: Never Smoker   . Smokeless tobacco: None  . Alcohol Use: No  . Drug Use: No  . Sexual Activity: Not Asked   Other Topics Concern  . None   Social History Narrative   HHof 2    Retired Theatre stage manager degree   g4 p4    Neg tad husband smokes but no ets.     Outpatient Encounter Prescriptions as of 04/30/2016  Medication Sig  . calcium carbonate (OS-CAL) 600 MG TABS tablet Take 600 mg by mouth 2 (two) times daily with a meal. With vit D3  . Cholecalciferol (VITAMIN D PO) Take 1 tablet by mouth daily.  . [DISCONTINUED] amoxicillin-clavulanate (AUGMENTIN) 875-125 MG tablet Take 1 tablet by mouth 2 (two) times daily.   No facility-administered encounter medications on file as of  04/30/2016.    EXAM:  BP 142/96 mmHg  Pulse 97  Temp(Src) 98 F (36.7 C) (Oral)  Ht 5' 5.75" (1.67 m)  Wt 199 lb 4 oz (90.379 kg)  BMI 32.41 kg/m2  SpO2 99%  Body mass index is 32.41 kg/(m^2).  Physical Exam: Vital signs reviewed QIH:[id number] is a well-developed well-nourished alert cooperative   who appears stated age in no acute distress.  HEENT: normocephalic atraumatic , Eyes: PERRL EOM's full, conjunctiva clear, Nares: paten,t no deformity discharge or tenderness., Ears: no deformity EAC's clear TMs with normal landmarks. Mouth: clear OP, no lesions, edema.  Moist mucous membranes. Dentition in adequate repair. NECK: supple without masses, thyromegaly or bruits.. CHEST/PULM:  Clear to auscultation and percussion breath sounds equal no wheeze , rales or rhonchi. No chest wall deformities or tenderness. CV: PMI is nondisplaced, S1 S2 no gallops, murmurs, rubs. Peripheral pulses are full without delay.No JVD .  ABDOMEN: Bowel sounds normal nontender  No guard or rebound, no hepato splenomegal no CVA tenderness.   Extremtities:  No clubbing cyanosis or edema, no acute joint swelling or redness no focal atrophy NEURO:  Oriented x3, cranial nerves 3-12 appear to be intact, no obvious focal weakness,gait within normal limits no abnormal reflexes or asymmetrical SKIN: No acute rashes normal turgor, color, no bruising or petechiae. PSYCH: Oriented, good eye contact, no obvious depression anxiety, cognition and judgment appear normal. LN: no cervical axillary inguinal adenopathy No noted deficits in memory, attention, and speech.   Lab Results  Component Value Date   WBC 5.3 04/23/2016   HGB 15.4* 04/23/2016   HCT 45.3 04/23/2016   PLT 225.0 04/23/2016   GLUCOSE 91 04/23/2016   CHOL 232* 04/23/2016   TRIG 128.0 04/23/2016   HDL 39.50 04/23/2016   LDLCALC 167* 04/23/2016   ALT 33 04/23/2016   AST 23 04/23/2016   NA 140 04/23/2016   K 4.2 04/23/2016   CL 104 04/23/2016    CREATININE 0.59 04/23/2016   BUN 17 04/23/2016   CO2 29 04/23/2016   TSH 1.70 04/23/2016   HGBA1C 5.9 04/30/2016    ASSESSMENT AND PLAN:  Discussed the following assessment and plan:  Visit for preventive health examination  Medicare annual wellness visit, subsequent  Hyperlipidemia  Fasting hyperglycemia - now normal a1c is 5.9 - Plan: POC HgB A1c  Estrogen deficiency - Plan: DG Bone Density  Elevated hemoglobin (HCC) - mild intermittent  no osa will follow  Sugar so much better with exercise and  low carb but lipids are worse  She has been eating lost osf red meat cream cheese  etc   No helpful for weight loss or lipids  . reviewed colon cancer screen acceptable for cologuard  Order to be faxed  Order placefor dexa  Pt says bp up cause no sleep will recheck to make sure ok at home Offered diet referral but she know what to do  Patient Care Team: Burnis Medin, MD as PCP - General (Internal Medicine)  Patient Instructions   Intensify lifestyle interventions. As you plan      Sugar so much better   Recheck cholesterol and hep c screen in 3-4 months .  No obv needed. Get bone density .    As planned   [facility] center.    Preventive Care for Adults, Female A healthy lifestyle and preventive care can promote health and wellness. Preventive health guidelines for women include the following key practices.  A routine yearly physical is a good way to check with your health care provider about your health and preventive screening. It is a chance to share any concerns and updates on your health and to receive a thorough exam.  Visit your dentist for a routine exam and preventive care every 6 months. Brush your teeth twice a day and floss once a day. Good oral hygiene prevents tooth decay and gum disease.  The frequency of eye exams is based on your age, health, family medical history, use of contact lenses, and other factors. Follow your health care provider's recommendations for  frequency of eye exams.  Eat a healthy diet. Foods like vegetables, fruits, whole grains, low-fat dairy products, and lean protein foods contain the nutrients you need without too many calories. Decrease your intake of foods high in solid fats, added sugars, and salt. Eat the right amount of calories for you.Get information about a proper diet from your health care provider, if necessary.  Regular physical exercise is one of the most important things you can do for your health. Most adults should get at least 150 minutes of moderate-intensity exercise (any activity that increases your heart rate and causes you to sweat) each week. In addition, most adults need muscle-strengthening exercises on 2 or more days a week.  Maintain a healthy weight. The body mass index (BMI) is a screening tool to identify possible weight problems. It provides an estimate of body fat based on height and weight. Your health care provider can find your BMI and can help you achieve or maintain a healthy weight.For adults 20 years and older:  A BMI below 18.5 is considered underweight.  A BMI of 18.5 to 24.9 is normal.  A BMI of 25 to 29.9 is considered overweight.  A BMI of 30 and above is considered obese.  Maintain normal blood lipids and cholesterol levels by exercising and minimizing your intake of saturated fat. Eat a balanced diet with plenty of fruit and vegetables. Blood tests for lipids and cholesterol should begin at age 64 and be repeated every 5 years. If your lipid or cholesterol levels are high, you are over 50, or you are at high risk for heart disease, you may need your cholesterol levels checked more frequently.Ongoing high lipid and cholesterol levels should be treated with medicines if diet and exercise are not working.  If you smoke, find out from your health care provider how to quit. If you do not use tobacco, do not start.  Lung cancer screening is recommended for  adults aged 52-80 years who are  at high risk for developing lung cancer because of a history of smoking. A yearly low-dose CT scan of the lungs is recommended for people who have at least a 30-pack-year history of smoking and are a current smoker or have quit within the past 15 years. A pack year of smoking is smoking an average of 1 pack of cigarettes a day for 1 year (for example: 1 pack a day for 30 years or 2 packs a day for 15 years). Yearly screening should continue until the smoker has stopped smoking for at least 15 years. Yearly screening should be stopped for people who develop a health problem that would prevent them from having lung cancer treatment.  If you are pregnant, do not drink alcohol. If you are breastfeeding, be very cautious about drinking alcohol. If you are not pregnant and choose to drink alcohol, do not have more than 1 drink per day. One drink is considered to be 12 ounces (355 mL) of beer, 5 ounces (148 mL) of wine, or 1.5 ounces (44 mL) of liquor.  Avoid use of street drugs. Do not share needles with anyone. Ask for help if you need support or instructions about stopping the use of drugs.  High blood pressure causes heart disease and increases the risk of stroke. Your blood pressure should be checked at least every 1 to 2 years. Ongoing high blood pressure should be treated with medicines if weight loss and exercise do not work.  If you are 75-36 years old, ask your health care provider if you should take aspirin to prevent strokes.  Diabetes screening is done by taking a blood sample to check your blood glucose level after you have not eaten for a certain period of time (fasting). If you are not overweight and you do not have risk factors for diabetes, you should be screened once every 3 years starting at age 75. If you are overweight or obese and you are 51-48 years of age, you should be screened for diabetes every year as part of your cardiovascular risk assessment.  Breast cancer screening is essential  preventive care for women. You should practice "breast self-awareness." This means understanding the normal appearance and feel of your breasts and may include breast self-examination. Any changes detected, no matter how small, should be reported to a health care provider. Women in their 34s and 30s should have a clinical breast exam (CBE) by a health care provider as part of a regular health exam every 1 to 3 years. After age 75, women should have a CBE every year. Starting at age 79, women should consider having a mammogram (breast X-ray test) every year. Women who have a family history of breast cancer should talk to their health care provider about genetic screening. Women at a high risk of breast cancer should talk to their health care providers about having an MRI and a mammogram every year.  Breast cancer gene (BRCA)-related cancer risk assessment is recommended for women who have family members with BRCA-related cancers. BRCA-related cancers include breast, ovarian, tubal, and peritoneal cancers. Having family members with these cancers may be associated with an increased risk for harmful changes (mutations) in the breast cancer genes BRCA1 and BRCA2. Results of the assessment will determine the need for genetic counseling and BRCA1 and BRCA2 testing.  Your health care provider may recommend that you be screened regularly for cancer of the pelvic organs (ovaries, uterus, and vagina). This screening involves  a pelvic examination, including checking for microscopic changes to the surface of your cervix (Pap test). You may be encouraged to have this screening done every 3 years, beginning at age 80.  For women ages 61-65, health care providers may recommend pelvic exams and Pap testing every 3 years, or they may recommend the Pap and pelvic exam, combined with testing for human papilloma virus (HPV), every 5 years. Some types of HPV increase your risk of cervical cancer. Testing for HPV may also be done  on women of any age with unclear Pap test results.  Other health care providers may not recommend any screening for nonpregnant women who are considered low risk for pelvic cancer and who do not have symptoms. Ask your health care provider if a screening pelvic exam is right for you.  If you have had past treatment for cervical cancer or a condition that could lead to cancer, you need Pap tests and screening for cancer for at least 20 years after your treatment. If Pap tests have been discontinued, your risk factors (such as having a new sexual partner) need to be reassessed to determine if screening should resume. Some women have medical problems that increase the chance of getting cervical cancer. In these cases, your health care provider may recommend more frequent screening and Pap tests.  Colorectal cancer can be detected and often prevented. Most routine colorectal cancer screening begins at the age of 76 years and continues through age 64 years. However, your health care provider may recommend screening at an earlier age if you have risk factors for colon cancer. On a yearly basis, your health care provider may provide home test kits to check for hidden blood in the stool. Use of a small camera at the end of a tube, to directly examine the colon (sigmoidoscopy or colonoscopy), can detect the earliest forms of colorectal cancer. Talk to your health care provider about this at age 46, when routine screening begins. Direct exam of the colon should be repeated every 5-10 years through age 76 years, unless early forms of precancerous polyps or small growths are found.  People who are at an increased risk for hepatitis B should be screened for this virus. You are considered at high risk for hepatitis B if:  You were born in a country where hepatitis B occurs often. Talk with your health care provider about which countries are considered high risk.  Your parents were born in a high-risk country and you  have not received a shot to protect against hepatitis B (hepatitis B vaccine).  You have HIV or AIDS.  You use needles to inject street drugs.  You live with, or have sex with, someone who has hepatitis B.  You get hemodialysis treatment.  You take certain medicines for conditions like cancer, organ transplantation, and autoimmune conditions.  Hepatitis C blood testing is recommended for all people born from [birth year] through 1965 and any individual with known risks for hepatitis C.  Practice safe sex. Use condoms and avoid high-risk sexual practices to reduce the spread of sexually transmitted infections (STIs). STIs include gonorrhea, chlamydia, syphilis, trichomonas, herpes, HPV, and human immunodeficiency virus (HIV). Herpes, HIV, and HPV are viral illnesses that have no cure. They can result in disability, cancer, and death.  You should be screened for sexually transmitted illnesses (STIs) including gonorrhea and chlamydia if:  You are sexually active and are younger than 24 years.  You are older than 24 years and your health care provider  tells you that you are at risk for this type of infection.  Your sexual activity has changed since you were last screened and you are at an increased risk for chlamydia or gonorrhea. Ask your health care provider if you are at risk.  If you are at risk of being infected with HIV, it is recommended that you take a prescription medicine daily to prevent HIV infection. This is called preexposure prophylaxis (PrEP). You are considered at risk if:  You are sexually active and do not regularly use condoms or know the HIV status of your partner(s).  You take drugs by injection.  You are sexually active with a partner who has HIV.  Talk with your health care provider about whether you are at high risk of being infected with HIV. If you choose to begin PrEP, you should first be tested for HIV. You should then be tested every 3 months for as long as you are  taking PrEP.  Osteoporosis is a disease in which the bones lose minerals and strength with aging. This can result in serious bone fractures or breaks. The risk of osteoporosis can be identified using a bone density scan. Women ages 82 years and over and women at risk for fractures or osteoporosis should discuss screening with their health care providers. Ask your health care provider whether you should take a calcium supplement or vitamin D to reduce the rate of osteoporosis.  Menopause can be associated with physical symptoms and risks. Hormone replacement therapy is available to decrease symptoms and risks. You should talk to your health care provider about whether hormone replacement therapy is right for you.  Use sunscreen. Apply sunscreen liberally and repeatedly throughout the day. You should seek shade when your shadow is shorter than you. Protect yourself by wearing long sleeves, pants, a wide-brimmed hat, and sunglasses year round, whenever you are outdoors.  Once a month, do a whole body skin exam, using a mirror to look at the skin on your back. Tell your health care provider of new moles, moles that have irregular borders, moles that are larger than a pencil eraser, or moles that have changed in shape or color.  Stay current with required vaccines (immunizations).  Influenza vaccine. All adults should be immunized every year.  Tetanus, diphtheria, and acellular pertussis (Td, Tdap) vaccine. Pregnant women should receive 1 dose of Tdap vaccine during each pregnancy. The dose should be obtained regardless of the length of time since the last dose. Immunization is preferred during the 27th-36th week of gestation. An adult who has not previously received Tdap or who does not know her vaccine status should receive 1 dose of Tdap. This initial dose should be followed by tetanus and diphtheria toxoids (Td) booster doses every 10 years. Adults with an unknown or incomplete history of completing a  3-dose immunization series with Td-containing vaccines should begin or complete a primary immunization series including a Tdap dose. Adults should receive a Td booster every 10 years.  Varicella vaccine. An adult without evidence of immunity to varicella should receive 2 doses or a second dose if she has previously received 1 dose. Pregnant females who do not have evidence of immunity should receive the first dose after pregnancy. This first dose should be obtained before leaving the health care facility. The second dose should be obtained 4-8 weeks after the first dose.  Human papillomavirus (HPV) vaccine. Females aged 13-26 years who have not received the vaccine previously should obtain the 3-dose series. The  vaccine is not recommended for use in pregnant females. However, pregnancy testing is not needed before receiving a dose. If a female is found to be pregnant after receiving a dose, no treatment is needed. In that case, the remaining doses should be delayed until after the pregnancy. Immunization is recommended for any person with an immunocompromised condition through the age of 61 years if she did not get any or all doses earlier. During the 3-dose series, the second dose should be obtained 4-8 weeks after the first dose. The third dose should be obtained 24 weeks after the first dose and 16 weeks after the second dose.  Zoster vaccine. One dose is recommended for adults aged 26 years or older unless certain conditions are present.  Measles, mumps, and rubella (MMR) vaccine. Adults born before [birth year] generally are considered immune to measles and mumps. Adults born in [birth year] or later should have 1 or more doses of MMR vaccine unless there is a contraindication to the vaccine or there is laboratory evidence of immunity to each of the three diseases. A routine second dose of MMR vaccine should be obtained at least 28 days after the first dose for students attending postsecondary schools, health care  workers, or international travelers. People who received inactivated measles vaccine or an unknown type of measles vaccine during 1963-1967 should receive 2 doses of MMR vaccine. People who received inactivated mumps vaccine or an unknown type of mumps vaccine before 1979 and are at high risk for mumps infection should consider immunization with 2 doses of MMR vaccine. For females of childbearing age, rubella immunity should be determined. If there is no evidence of immunity, females who are not pregnant should be vaccinated. If there is no evidence of immunity, females who are pregnant should delay immunization until after pregnancy. Unvaccinated health care workers born before [birth year] who lack laboratory evidence of measles, mumps, or rubella immunity or laboratory confirmation of disease should consider measles and mumps immunization with 2 doses of MMR vaccine or rubella immunization with 1 dose of MMR vaccine.  Pneumococcal 13-valent conjugate (PCV13) vaccine. When indicated, a person who is uncertain of his immunization history and has no record of immunization should receive the PCV13 vaccine. All adults 54 years of age and older should receive this vaccine. An adult aged 53 years or older who has certain medical conditions and has not been previously immunized should receive 1 dose of PCV13 vaccine. This PCV13 should be followed with a dose of pneumococcal polysaccharide (PPSV23) vaccine. Adults who are at high risk for pneumococcal disease should obtain the PPSV23 vaccine at least 8 weeks after the dose of PCV13 vaccine. Adults older than 68 years of age who have normal immune system function should obtain the PPSV23 vaccine dose at least 1 year after the dose of PCV13 vaccine.  Pneumococcal polysaccharide (PPSV23) vaccine. When PCV13 is also indicated, PCV13 should be obtained first. All adults aged 70 years and older should be immunized. An adult younger than age 57 years who has certain medical  conditions should be immunized. Any person who resides in a nursing home or long-term care facility should be immunized. An adult smoker should be immunized. People with an immunocompromised condition and certain other conditions should receive both PCV13 and PPSV23 vaccines. People with human immunodeficiency virus (HIV) infection should be immunized as soon as possible after diagnosis. Immunization during chemotherapy or radiation therapy should be avoided. Routine use of PPSV23 vaccine is not recommended for American Indians, [place], or  people younger than 65 years unless there are medical conditions that require PPSV23 vaccine. When indicated, people who have unknown immunization and have no record of immunization should receive PPSV23 vaccine. One-time revaccination 5 years after the first dose of PPSV23 is recommended for people aged 19-64 years who have chronic kidney failure, nephrotic syndrome, asplenia, or immunocompromised conditions. People who received 1-2 doses of PPSV23 before age 81 years should receive another dose of PPSV23 vaccine at age 76 years or later if at least 5 years have passed since the previous dose. Doses of PPSV23 are not needed for people immunized with PPSV23 at or after age 104 years.  Meningococcal vaccine. Adults with asplenia or persistent complement component deficiencies should receive 2 doses of quadrivalent meningococcal conjugate (MenACWY-D) vaccine. The doses should be obtained at least 2 months apart. Microbiologists working with certain meningococcal bacteria, [employer], people at risk during an outbreak, and people who travel to or live in countries with a high rate of meningitis should be immunized. A first-year college student up through age 34 years who is living in a residence hall should receive a dose if she did not receive a dose on or after her 16th birthday. Adults who have certain high-risk conditions should receive one or more doses of  vaccine.  Hepatitis A vaccine. Adults who wish to be protected from this disease, have certain high-risk conditions, work with hepatitis A-infected animals, work in hepatitis A research labs, or travel to or work in countries with a high rate of hepatitis A should be immunized. Adults who were previously unvaccinated and who anticipate close contact with an international adoptee during the first 60 days after arrival in the [place] from a country with a high rate of hepatitis A should be immunized.  Hepatitis B vaccine. Adults who wish to be protected from this disease, have certain high-risk conditions, may be exposed to blood or other infectious body fluids, are household contacts or sex partners of hepatitis B positive people, are clients or workers in certain care facilities, or travel to or work in countries with a high rate of hepatitis B should be immunized.  Haemophilus influenzae type b (Hib) vaccine. A previously unvaccinated person with asplenia or sickle cell disease or having a scheduled splenectomy should receive 1 dose of Hib vaccine. Regardless of previous immunization, a recipient of a hematopoietic stem cell transplant should receive a 3-dose series 6-12 months after her successful transplant. Hib vaccine is not recommended for adults with HIV infection. Preventive Services / Frequency Ages 88 to 8 years  Blood pressure check.** / Every 3-5 years.  Lipid and cholesterol check.** / Every 5 years beginning at age 65.  Clinical breast exam.** / Every 3 years for women in their 55s and 41s.  BRCA-related cancer risk assessment.** / For women who have family members with a BRCA-related cancer (breast, ovarian, tubal, or peritoneal cancers).  Pap test.** / Every 2 years from ages 11 through 61. Every 3 years starting at age 42 through age 39 or 44 with a history of 3 consecutive normal Pap tests.  HPV screening.** / Every 3 years from ages 8 through ages 48 to 24 with a  history of 3 consecutive normal Pap tests.  Hepatitis C blood test.** / For any individual with known risks for hepatitis C.  Skin self-exam. / Monthly.  Influenza vaccine. / Every year.  Tetanus, diphtheria, and acellular pertussis (Tdap, Td) vaccine.** / Consult your health care provider. Pregnant women should receive  1 dose of Tdap vaccine during each pregnancy. 1 dose of Td every 10 years.  Varicella vaccine.** / Consult your health care provider. Pregnant females who do not have evidence of immunity should receive the first dose after pregnancy.  HPV vaccine. / 3 doses over 6 months, if 24 and younger. The vaccine is not recommended for use in pregnant females. However, pregnancy testing is not needed before receiving a dose.  Measles, mumps, rubella (MMR) vaccine.** / You need at least 1 dose of MMR if you were born in 1957 or later. You may also need a 2nd dose. For females of childbearing age, rubella immunity should be determined. If there is no evidence of immunity, females who are not pregnant should be vaccinated. If there is no evidence of immunity, females who are pregnant should delay immunization until after pregnancy.  Pneumococcal 13-valent conjugate (PCV13) vaccine.** / Consult your health care provider.  Pneumococcal polysaccharide (PPSV23) vaccine.** / 1 to 2 doses if you smoke cigarettes or if you have certain conditions.  Meningococcal vaccine.** / 1 dose if you are age 70 to 22 years and a Market researcher living in a residence hall, or have one of several medical conditions, you need to get vaccinated against meningococcal disease. You may also need additional booster doses.  Hepatitis A vaccine.** / Consult your health care provider.  Hepatitis B vaccine.** / Consult your health care provider.  Haemophilus influenzae type b (Hib) vaccine.** / Consult your health care provider. Ages 9 to 55 years  Blood pressure check.** / Every year.  Lipid and  cholesterol check.** / Every 5 years beginning at age 68 years.  Lung cancer screening. / Every year if you are aged 88-80 years and have a 30-pack-year history of smoking and currently smoke or have quit within the past 15 years. Yearly screening is stopped once you have quit smoking for at least 15 years or develop a health problem that would prevent you from having lung cancer treatment.  Clinical breast exam.** / Every year after age 40 years.  BRCA-related cancer risk assessment.** / For women who have family members with a BRCA-related cancer (breast, ovarian, tubal, or peritoneal cancers).  Mammogram.** / Every year beginning at age 79 years and continuing for as long as you are in good health. Consult with your health care provider.  Pap test.** / Every 3 years starting at age 27 years through age 2 or 13 years with a history of 3 consecutive normal Pap tests.  HPV screening.** / Every 3 years from ages 82 years through ages 64 to 62 years with a history of 3 consecutive normal Pap tests.  Fecal occult blood test (FOBT) of stool. / Every year beginning at age 48 years and continuing until age 59 years. You may not need to do this test if you get a colonoscopy every 10 years.  Flexible sigmoidoscopy or colonoscopy.** / Every 5 years for a flexible sigmoidoscopy or every 10 years for a colonoscopy beginning at age 10 years and continuing until age 72 years.  Hepatitis C blood test.** / For all people born from [birth year] through 1963 and any individual with known risks for hepatitis C.  Skin self-exam. / Monthly.  Influenza vaccine. / Every year.  Tetanus, diphtheria, and acellular pertussis (Tdap/Td) vaccine.** / Consult your health care provider. Pregnant women should receive 1 dose of Tdap vaccine during each pregnancy. 1 dose of Td every 10 years.  Varicella vaccine.** / Consult your health care provider.  Pregnant females who do not have evidence of immunity should receive the first  dose after pregnancy.  Zoster vaccine.** / 1 dose for adults aged 36 years or older.  Measles, mumps, rubella (MMR) vaccine.** / You need at least 1 dose of MMR if you were born in 1957 or later. You may also need a second dose. For females of childbearing age, rubella immunity should be determined. If there is no evidence of immunity, females who are not pregnant should be vaccinated. If there is no evidence of immunity, females who are pregnant should delay immunization until after pregnancy.  Pneumococcal 13-valent conjugate (PCV13) vaccine.** / Consult your health care provider.  Pneumococcal polysaccharide (PPSV23) vaccine.** / 1 to 2 doses if you smoke cigarettes or if you have certain conditions.  Meningococcal vaccine.** / Consult your health care provider.  Hepatitis A vaccine.** / Consult your health care provider.  Hepatitis B vaccine.** / Consult your health care provider.  Haemophilus influenzae type b (Hib) vaccine.** / Consult your health care provider. Ages 24 years and over  Blood pressure check.** / Every year.  Lipid and cholesterol check.** / Every 5 years beginning at age 50 years.  Lung cancer screening. / Every year if you are aged 74-80 years and have a 30-pack-year history of smoking and currently smoke or have quit within the past 15 years. Yearly screening is stopped once you have quit smoking for at least 15 years or develop a health problem that would prevent you from having lung cancer treatment.  Clinical breast exam.** / Every year after age 46 years.  BRCA-related cancer risk assessment.** / For women who have family members with a BRCA-related cancer (breast, ovarian, tubal, or peritoneal cancers).  Mammogram.** / Every year beginning at age 57 years and continuing for as long as you are in good health. Consult with your health care provider.  Pap test.** / Every 3 years starting at age 75 years through age 24 or 79 years with 3 consecutive normal Pap  tests. Testing can be stopped between 65 and 70 years with 3 consecutive normal Pap tests and no abnormal Pap or HPV tests in the past 10 years.  HPV screening.** / Every 3 years from ages 53 years through ages 38 or 1 years with a history of 3 consecutive normal Pap tests. Testing can be stopped between 65 and 70 years with 3 consecutive normal Pap tests and no abnormal Pap or HPV tests in the past 10 years.  Fecal occult blood test (FOBT) of stool. / Every year beginning at age 55 years and continuing until age 71 years. You may not need to do this test if you get a colonoscopy every 10 years.  Flexible sigmoidoscopy or colonoscopy.** / Every 5 years for a flexible sigmoidoscopy or every 10 years for a colonoscopy beginning at age 72 years and continuing until age 62 years.  Hepatitis C blood test.** / For all people born from [birth year] through 1963 and any individual with known risks for hepatitis C.  Osteoporosis screening.** / A one-time screening for women ages 53 years and over and women at risk for fractures or osteoporosis.  Skin self-exam. / Monthly.  Influenza vaccine. / Every year.  Tetanus, diphtheria, and acellular pertussis (Tdap/Td) vaccine.** / 1 dose of Td every 10 years.  Varicella vaccine.** / Consult your health care provider.  Zoster vaccine.** / 1 dose for adults aged 42 years or older.  Pneumococcal 13-valent conjugate (PCV13) vaccine.** / Consult your health care  provider.  Pneumococcal polysaccharide (PPSV23) vaccine.** / 1 dose for all adults aged 29 years and older.  Meningococcal vaccine.** / Consult your health care provider.  Hepatitis A vaccine.** / Consult your health care provider.  Hepatitis B vaccine.** / Consult your health care provider.  Haemophilus influenzae type b (Hib) vaccine.** / Consult your health care provider. ** Family history and personal history of risk and conditions may change your health care provider's recommendations.   This  information is not intended to replace advice given to you by your health care provider. Make sure you discuss any questions you have with your health care provider.   Document Released: 01/20/2002 Document Revised: 12/15/2014 Document Reviewed: 04/21/2011 Elsevier Interactive Patient Education [employer].        Why follow it? Research shows. Those who follow the Mediterranean diet have a reduced risk of heart disease  The diet is associated with a reduced incidence of Parkinson's and Alzheimer's diseases People following the diet may have longer life expectancies and lower rates of chronic diseases  The Dietary Guidelines for Americans recommends the Mediterranean diet as an eating plan to promote health and prevent disease  What Is the Mediterranean Diet?  Healthy eating plan based on typical foods and recipes of Mediterranean-style cooking The diet is primarily a plant based diet; these foods should make up a majority of meals   Starches - Plant based foods should make up a majority of meals - They are an important sources of vitamins, minerals, energy, antioxidants, and fiber - Choose whole grains, foods high in fiber and minimally processed items  - Typical grain sources include wheat, oats, barley, corn, brown rice, bulgar, farro, millet, polenta, couscous  - Various types of beans include chickpeas, lentils, fava beans, black beans, white beans   Fruits  Veggies - Large quantities of antioxidant rich fruits & veggies; 6 or more servings  - Vegetables can be eaten raw or lightly drizzled with oil and cooked  - Vegetables common to the traditional Mediterranean Diet include: artichokes, arugula, beets, broccoli, brussel sprouts, cabbage, carrots, celery, collard greens, cucumbers, eggplant, kale, leeks, lemons, lettuce, mushrooms, okra, onions, peas, peppers, potatoes, pumpkin, radishes, rutabaga, shallots, spinach, sweet potatoes, turnips, zucchini - Fruits common to the  Mediterranean Diet include: apples, apricots, avocados, cherries, clementines, dates, figs, grapefruits, grapes, melons, nectarines, oranges, peaches, pears, pomegranates, strawberries, tangerines  Fats - Replace butter and margarine with healthy oils, such as olive oil, canola oil, and tahini  - Limit nuts to no more than a handful a day  - Nuts include walnuts, almonds, pecans, pistachios, pine nuts  - Limit or avoid candied, honey roasted or heavily salted nuts - Olives are central to the [employer] - can be eaten whole or used in a variety of dishes   Meats Protein - Limiting red meat: no more than a few times a month - When eating red meat: choose lean cuts and keep the portion to the size of deck of cards - Eggs: approx. 0 to 4 times a week  - Fish and lean poultry: at least 2 a week  - Healthy protein sources include, chicken, [place], lean beef, lamb - Increase intake of seafood such as tuna, salmon, trout, mackerel, shrimp, scallops - Avoid or limit high fat processed meats such as sausage and bacon  Dairy - Include moderate amounts of low fat dairy products  - Focus on healthy dairy such as fat free yogurt, skim milk, low or reduced fat cheese -  Limit dairy products higher in fat such as whole or 2% milk, cheese, ice cream  Alcohol - Moderate amounts of red wine is ok  - No more than 5 oz daily for women (all ages) and men older than age 29  - No more than 10 oz of wine daily for men younger than 33  Other - Limit sweets and other desserts  - Use herbs and spices instead of salt to flavor foods  - Herbs and spices common to the traditional Mediterranean Diet include: basil, bay leaves, chives, cloves, cumin, fennel, garlic, lavender, marjoram, mint, oregano, parsley, pepper, rosemary, sage, savory, sumac, tarragon, thyme   It's not just a diet, it's a lifestyle:  The Mediterranean diet includes lifestyle factors typical of those in the region  Foods, drinks and meals are  best eaten with others and savored Daily physical activity is important for overall good health This could be strenuous exercise like running and aerobics This could also be more leisurely activities such as walking, housework, yard-work, or taking the stairs Moderation is the key; a balanced and healthy diet accommodates most foods and drinks Consider portion sizes and frequency of consumption of certain foods   Meal Ideas & Options:  Breakfast:  Whole wheat toast or whole wheat English muffins with peanut butter & hard boiled egg Steel cut oats topped with apples & cinnamon and skim milk  Fresh fruit: banana, strawberries, melon, berries, peaches  Smoothies: strawberries, bananas, greek yogurt, peanut butter Low fat greek yogurt with blueberries and granola  Egg white omelet with spinach and mushrooms Breakfast couscous: whole wheat couscous, apricots, skim milk, cranberries  Sandwiches:  Hummus and grilled vegetables (peppers, zucchini, squash) on whole wheat bread   Grilled chicken on whole wheat pita with lettuce, tomatoes, cucumbers or tzatziki  [place] salad on whole wheat bread: tuna salad made with greek yogurt, olives, red peppers, capers, green onions Garlic rosemary lamb pita: lamb sauted with garlic, rosemary, salt & pepper; add lettuce, cucumber, greek yogurt to pita - flavor with lemon juice and black pepper  Seafood:  Mediterranean grilled salmon, seasoned with garlic, basil, parsley, lemon juice and black pepper Shrimp, lemon, and spinach whole-grain pasta salad made with low fat greek yogurt  Seared scallops with lemon orzo  Seared tuna steaks seasoned salt, pepper, coriander topped with tomato mixture of olives, tomatoes, olive oil, minced garlic, parsley, green onions and cappers  Meats:  Herbed greek chicken salad with kalamata olives, cucumber, feta  Red bell peppers stuffed with spinach, bulgur, lean ground beef (or lentils) & topped with feta   Kebabs: skewers of  chicken, tomatoes, onions, zucchini, squash  [place] burgers: made with red onions, mint, dill, lemon juice, feta cheese topped with roasted red peppers Vegetarian Cucumber salad: cucumbers, artichoke hearts, celery, red onion, feta cheese, tossed in olive oil & lemon juice  Hummus and whole grain pita points with a greek salad (lettuce, tomato, feta, olives, cucumbers, red onion) Lentil soup with celery, carrots made with vegetable broth, garlic, salt and pepper  Tabouli salad: parsley, bulgur, mint, scallions, cucumbers, tomato, radishes, lemon juice, olive oil, salt and pepper.         Standley Brooking. Xan Sparkman M.D.

## 2016-04-30 ENCOUNTER — Encounter: Payer: Self-pay | Admitting: Internal Medicine

## 2016-04-30 ENCOUNTER — Ambulatory Visit (INDEPENDENT_AMBULATORY_CARE_PROVIDER_SITE_OTHER): Payer: Medicare Other | Admitting: Internal Medicine

## 2016-04-30 VITALS — BP 142/96 | HR 97 | Temp 98.0°F | Ht 65.75 in | Wt 199.2 lb

## 2016-04-30 DIAGNOSIS — R7301 Impaired fasting glucose: Secondary | ICD-10-CM | POA: Diagnosis not present

## 2016-04-30 DIAGNOSIS — D582 Other hemoglobinopathies: Secondary | ICD-10-CM

## 2016-04-30 DIAGNOSIS — E785 Hyperlipidemia, unspecified: Secondary | ICD-10-CM | POA: Diagnosis not present

## 2016-04-30 DIAGNOSIS — Z0000 Encounter for general adult medical examination without abnormal findings: Secondary | ICD-10-CM

## 2016-04-30 DIAGNOSIS — E2839 Other primary ovarian failure: Secondary | ICD-10-CM

## 2016-04-30 LAB — POCT GLYCOSYLATED HEMOGLOBIN (HGB A1C): Hemoglobin A1C: 5.9

## 2016-04-30 NOTE — Assessment & Plan Note (Signed)
Deteriorated see note  Wants to do ls i and recheck   Check in 3-- months wi

## 2016-04-30 NOTE — Progress Notes (Signed)
Pre visit review using our clinic review tool, if applicable. No additional management support is needed unless otherwise documented below in the visit note. 

## 2016-05-24 LAB — COLOGUARD: Cologuard: NEGATIVE

## 2016-06-03 ENCOUNTER — Ambulatory Visit
Admission: RE | Admit: 2016-06-03 | Discharge: 2016-06-03 | Disposition: A | Discharge status: Home / Self Care | Payer: Medicare Other | Source: Ambulatory Visit | Attending: Internal Medicine | Admitting: Internal Medicine

## 2016-06-03 DIAGNOSIS — E2839 Other primary ovarian failure: Secondary | ICD-10-CM

## 2016-06-09 ENCOUNTER — Encounter: Payer: Self-pay | Admitting: Family Medicine

## 2016-06-12 ENCOUNTER — Telehealth: Payer: Self-pay | Admitting: Internal Medicine

## 2016-06-12 NOTE — Telephone Encounter (Signed)
Pt following up on cologuard test done 6/17, order faxed 6/19.  Pt states cologuard results sent to us. If we did not receive please call [phone]

## 2016-06-13 NOTE — Telephone Encounter (Signed)
Left a message with patient's husband for a return call. 

## 2016-06-16 NOTE — Telephone Encounter (Signed)
Spoke to the pt.  She received letter that was sent out on 06/09/16 informing her of normal results.  No questions at this time.  Will close the note.

## 2016-07-15 ENCOUNTER — Ambulatory Visit: Payer: Medicare Other

## 2016-07-15 ENCOUNTER — Other Ambulatory Visit: Payer: Medicare Other

## 2016-08-25 ENCOUNTER — Other Ambulatory Visit (INDEPENDENT_AMBULATORY_CARE_PROVIDER_SITE_OTHER): Payer: Medicare Other

## 2016-08-25 DIAGNOSIS — Z1159 Encounter for screening for other viral diseases: Secondary | ICD-10-CM

## 2016-08-25 DIAGNOSIS — E785 Hyperlipidemia, unspecified: Secondary | ICD-10-CM

## 2016-08-25 LAB — LIPID PANEL
CHOL/HDL RATIO: 4
Cholesterol: 181 mg/dL (ref 0–200)
HDL: 41.1 mg/dL (ref >39.00)
LDL Cholesterol: 121 mg/dL — ABNORMAL HIGH (ref 0–99)
NONHDL: 139.72
Triglycerides: 93 mg/dL (ref 0.0–149.0)
VLDL: 18.6 mg/dL (ref 0.0–40.0)

## 2016-08-26 LAB — HEPATITIS C ANTIBODY: HCV AB: NEGATIVE

## 2016-08-27 ENCOUNTER — Telehealth: Payer: Self-pay | Admitting: Internal Medicine

## 2016-08-27 NOTE — Telephone Encounter (Signed)
Please see result note  Negative c lipids much better

## 2016-08-27 NOTE — Telephone Encounter (Signed)
Pt viewed results on MyChart.  No questions at this time. 

## 2016-08-27 NOTE — Telephone Encounter (Signed)
Pt would like blood work results °

## 2016-09-22 ENCOUNTER — Other Ambulatory Visit: Payer: Self-pay | Admitting: Otolaryngology

## 2016-09-22 DIAGNOSIS — E041 Nontoxic single thyroid nodule: Secondary | ICD-10-CM

## 2016-09-26 ENCOUNTER — Ambulatory Visit
Admission: RE | Admit: 2016-09-26 | Discharge: 2016-09-26 | Disposition: A | Discharge status: Home / Self Care | Payer: Medicare Other | Source: Ambulatory Visit | Attending: Otolaryngology | Admitting: Otolaryngology

## 2016-09-26 DIAGNOSIS — E041 Nontoxic single thyroid nodule: Secondary | ICD-10-CM

## 2016-10-03 ENCOUNTER — Other Ambulatory Visit: Payer: Self-pay | Admitting: Otolaryngology

## 2016-10-03 DIAGNOSIS — E042 Nontoxic multinodular goiter: Secondary | ICD-10-CM

## 2016-10-14 ENCOUNTER — Other Ambulatory Visit (HOSPITAL_COMMUNITY)
Admission: RE | Admit: 2016-10-14 | Discharge: 2016-10-14 | Disposition: A | Discharge status: Home / Self Care | Payer: Medicare Other | Source: Ambulatory Visit | Attending: General Surgery | Admitting: General Surgery

## 2016-10-14 ENCOUNTER — Ambulatory Visit
Admission: RE | Admit: 2016-10-14 | Discharge: 2016-10-14 | Disposition: A | Discharge status: Home / Self Care | Payer: Medicare Other | Source: Ambulatory Visit | Attending: Otolaryngology | Admitting: Otolaryngology

## 2016-10-14 DIAGNOSIS — E042 Nontoxic multinodular goiter: Secondary | ICD-10-CM

## 2016-12-29 ENCOUNTER — Other Ambulatory Visit: Payer: Self-pay | Admitting: Internal Medicine

## 2016-12-29 DIAGNOSIS — Z1231 Encounter for screening mammogram for malignant neoplasm of breast: Secondary | ICD-10-CM

## 2017-02-11 ENCOUNTER — Ambulatory Visit
Admission: RE | Admit: 2017-02-11 | Discharge: 2017-02-11 | Disposition: A | Discharge status: Home / Self Care | Payer: Medicare Other | Source: Ambulatory Visit | Attending: Internal Medicine | Admitting: Internal Medicine

## 2017-02-11 DIAGNOSIS — Z1231 Encounter for screening mammogram for malignant neoplasm of breast: Secondary | ICD-10-CM

## 2017-08-28 ENCOUNTER — Encounter: Payer: Self-pay | Admitting: Internal Medicine

## 2017-09-09 ENCOUNTER — Encounter: Payer: Medicare Other | Admitting: Internal Medicine

## 2017-09-22 NOTE — Progress Notes (Signed)
Chief Complaint  Patient presents with  . Annual Exam    no new concerns. Med refills.     HPI: Heather Lamb 71 y.o. comes in today for Preventive Medicare exam/ wellness visit .Since last visit.  Doing ok  No concerns. X    Hard to lose weight  Exercising   Ref  Feels well .  ocass anxiety but ok now . Refill valtrex   Retired   But busy   Health Maintenance  Topic Date Due  . COLON CANCER SCREENING ANNUAL FOBT  03/24/2017  . INFLUENZA VACCINE  03/07/2018 (Originally 07/08/2017)  . MAMMOGRAM  02/12/2019  . TETANUS/TDAP  09/24/2027  . DEXA SCAN  Completed  . Hepatitis C Screening  Completed  . PNA vac Low Risk Adult  Completed   Health Maintenance Review LIFESTYLE:  Exercise:   gym and every day  [place] 4 miles  Tobacco/ETS:no Alcohol: ocass  Sugar beverages: Sleep:   Drug use: no    HH: 2 dog    Hearing:  ok  Vision:  No limitations at present . Last eye check UTD  Safety:  Has smoke detector and wears seat belts.  No firearms. No excess sun exposure. Sees dentist regularly.  Falls:  no.  Memory: Felt to be good  , no concern from her or her family.  Depression: No anhedonia unusual crying or depressive symptoms  Nutrition: Eats well balanced diet; adequate calcium and vitamin D. No swallowing chewing problems.  Injury: no major injuries in the last six months.  Other healthcare providers:  Reviewed today .  Social:  Lives with spouse married.dogs       Preventive parameters: up-to-date  Reviewed   ADLS:   There are no problems or need for assistance  driving, feeding, obtaining food, dressing, toileting and bathing, managing money using phone. She is independent. Active    ROS:  GEN/ HEENT: No fever, significant weight changes sweats headaches vision problems hearing changes, CV/ PULM; No chest pain shortness of breath cough, syncope,edema  change in exercise tolerance. GI /GU: No adominal pain, vomiting, change in bowel habits. No blood in the  stool. No significant GU symptoms. SKIN/HEME: ,no acute skin rashes suspicious lesions or bleeding. No lymphadenopathy, nodules, masses.  NEURO/ PSYCH:  No neurologic signs such as weakness numbness. No depression anxiety. IMM/ Allergy: No unusual infections.  Allergy .   REST of 12 system review negative except as per HPI   Past Medical History:  Diagnosis Date  . Allergy   . Anxiety   . Epiglottitis 11/13/2015  . History of UTI   . Kidney stones   . Panic attacks 08/23/2014  . Right lower lobe pneumonia ([place]) 10/11/2011  . Supraglottitis 11/13/2015    Family History  Problem Relation Age of Onset  . Arthritis Father   . Heart disease Father   . Hypertension Unknown        parent  . Stroke Unknown   . Hyperlipidemia Unknown     Social History   Social History  . Marital status: Married    Spouse name: N/A  . Number of children: N/A  . Years of education: N/A   Social History Main Topics  . Smoking status: Never Smoker  . Smokeless tobacco: Never Used  . Alcohol use No  . Drug use: No  . Sexual activity: Not Asked   Other Topics Concern  . None   Social History Narrative   HHof 2    Retired  aMAth teacher    BS degree   g4 p4    Neg tad husband smokes but no ets.     Outpatient Encounter Prescriptions as of 09/23/2017  Medication Sig  . calcium carbonate (OS-CAL) 600 MG TABS tablet Take 600 mg by mouth 2 (two) times daily with a meal. With vit D3  . Cholecalciferol (VITAMIN D PO) Take 1 tablet by mouth daily.  . valACYclovir (VALTREX) 1000 MG tablet Take 2 tablets (2,000 mg total) by mouth 2 (two) times daily. For cold sores.  . [EXPIRED] Zoster Vaccine Adjuvanted [facility]) injection Inject 0.5 mLs into the muscle once. Repeat in 2-6 months   No facility-administered encounter medications on file as of 09/23/2017.     EXAM:  BP 124/80 (BP Location: Right Arm, Patient Position: Sitting, Cuff Size: Normal)   Pulse 89   Temp 97.7 F (36.5 C) (Oral)   Ht  5' 5.75" (1.67 m)   Wt 202 lb 12.8 oz (92 kg)   BMI 32.98 kg/m   Body mass index is 32.98 kg/m.  Physical Exam: Vital signs reviewed MAY:OKHT is a well-developed well-nourished alert cooperative   who appears stated age in no acute distress.  HEENT: normocephalic atraumatic , Eyes: PERRL EOM's full, conjunctiva clear, Nares: paten,t no deformity discharge or tenderness., Ears: no deformity EAC's clear TMs with normal landmarks. Mouth: clear OP, no lesions, edema.  Moist mucous membranes. Dentition in adequate repair. NECK: supple without masses, thyromegaly or bruits. CHEST/PULM:  Clear to auscultation and percussion breath sounds equal no wheeze , rales or rhonchi. No chest wall deformities or tenderness.Breast: normal by inspection . No dimpling, discharge, masses, tenderness or discharge . CV: PMI is nondisplaced, S1 S2 no gallops, murmurs, rubs. Peripheral pulses are full without delay.No JVD .  ABDOMEN: Bowel sounds normal nontender  No guard or rebound, no hepato splenomegal no CVA tenderness.   Extremtities:  No clubbing cyanosis or edema, no acute joint swelling or redness no focal atrophy NEURO:  Oriented x3, cranial nerves 3-12 appear to be intact, no obvious focal weakness,gait within normal limits no abnormal reflexes or asymmetrical SKIN: No acute rashes normal turgor, color, no bruising or petechiae. Sun changes  PSYCH: Oriented, good eye contact, no obvious depression anxiety, cognition and judgment appear normal. LN: no cervical axillary inguinal adenopathy No noted deficits in memory, attention, and speech.    BP Readings from Last 3 Encounters:  09/23/17 124/80  04/30/16 (!) 142/96  11/15/15 (!) 142/86    Wt Readings from Last 3 Encounters:  09/23/17 202 lb 12.8 oz (92 kg)  04/30/16 199 lb 4 oz (90.4 kg)  11/14/15 203 lb 11.3 oz (92.4 kg)    ASSESSMENT AND PLAN:  Discussed the following assessment and plan:  Visit for preventive health examination - Plan:  Basic metabolic panel, CBC with Differential/Platelet, Hepatic function panel, Lipid panel, TSH  Hyperlipidemia, unspecified hyperlipidemia type - Plan: Basic metabolic panel, CBC with Differential/Platelet, Hepatic function panel, Lipid panel, TSH  Elevated hemoglobin (HCC) - uncertain sig  borderline no osa - Plan: Basic metabolic panel, CBC with Differential/Platelet, Hepatic function panel, Lipid panel, TSH  Osteopenia, unspecified location - Plan: Basic metabolic panel, CBC with Differential/Platelet, Hepatic function panel, Lipid panel, TSH  Need for Td vaccine Ave risk  Colon due 2020  cologuard   . lsi for lipic control   Disc preventive measures   Lipid check pt says if up again with reinstate lsi Patient Care Team: Burnis Medin, MD as PCP -  General (Internal Medicine) Allyn Kenner, MD (Dermatology) Lab Results  Component Value Date   WBC 6.2 09/23/2017   HGB 15.0 09/23/2017   HCT 45.1 09/23/2017   PLT 205.0 09/23/2017   GLUCOSE 87 09/23/2017   CHOL 238 (H) 09/23/2017   TRIG 180.0 (H) 09/23/2017   HDL 46.80 09/23/2017   LDLCALC 155 (H) 09/23/2017   ALT 25 09/23/2017   AST 21 09/23/2017   NA 139 09/23/2017   K 3.9 09/23/2017   CL 102 09/23/2017   CREATININE 0.63 09/23/2017   BUN 18 09/23/2017   CO2 29 09/23/2017   TSH 1.02 09/23/2017   HGBA1C 5.9 04/30/2016    Patient Instructions  Continue lifestyle intervention healthy eating and exercise . Try stopping evening snacks and     Breads .   For  3 weeks .   See if helps.  cologuard due every 3 years   2020  shingrix at pharmacy.       Preventive Care 61 Years and Older, Female Preventive care refers to lifestyle choices and visits with your health care provider that can promote health and wellness. What does preventive care include?  A yearly physical exam. This is also called an annual well check.  Dental exams once or twice a year.  Routine eye exams. Ask your health care provider how often you should  have your eyes checked.  Personal lifestyle choices, including: ? Daily care of your teeth and gums. ? Regular physical activity. ? Eating a healthy diet. ? Avoiding tobacco and drug use. ? Limiting alcohol use. ? Practicing safe sex. ? Taking low-dose aspirin every day. ? Taking vitamin and mineral supplements as recommended by your health care provider. What happens during an annual well check? The services and screenings done by your health care provider during your annual well check will depend on your age, overall health, lifestyle risk factors, and family history of disease. Counseling Your health care provider may ask you questions about your:  Alcohol use.  Tobacco use.  Drug use.  Emotional well-being.  Home and relationship well-being.  Sexual activity.  Eating habits.  History of falls.  Memory and ability to understand (cognition).  Work and work Statistician.  Reproductive health.  Screening You may have the following tests or measurements:  Height, weight, and BMI.  Blood pressure.  Lipid and cholesterol levels. These may be checked every 5 years, or more frequently if you are over 63 years old.  Skin check.  Lung cancer screening. You may have this screening every year starting at age 50 if you have a 30-pack-year history of smoking and currently smoke or have quit within the past 15 years.  Fecal occult blood test (FOBT) of the stool. You may have this test every year starting at age 82.  Flexible sigmoidoscopy or colonoscopy. You may have a sigmoidoscopy every 5 years or a colonoscopy every 10 years starting at age 51.  Hepatitis C blood test.  Hepatitis B blood test.  Sexually transmitted disease (STD) testing.  Diabetes screening. This is done by checking your blood sugar (glucose) after you have not eaten for a while (fasting). You may have this done every 1-3 years.  Bone density scan. This is done to screen for osteoporosis. You may  have this done starting at age 59.  Mammogram. This may be done every 1-2 years. Talk to your health care provider about how often you should have regular mammograms.  Talk with your health care provider about your  test results, treatment options, and if necessary, the need for more tests. Vaccines Your health care provider may recommend certain vaccines, such as:  Influenza vaccine. This is recommended every year.  Tetanus, diphtheria, and acellular pertussis (Tdap, Td) vaccine. You may need a Td booster every 10 years.  Varicella vaccine. You may need this if you have not been vaccinated.  Zoster vaccine. You may need this after age 59.  Measles, mumps, and rubella (MMR) vaccine. You may need at least one dose of MMR if you were born in 1957 or later. You may also need a second dose.  Pneumococcal 13-valent conjugate (PCV13) vaccine. One dose is recommended after age 73.  Pneumococcal polysaccharide (PPSV23) vaccine. One dose is recommended after age 57.  Meningococcal vaccine. You may need this if you have certain conditions.  Hepatitis A vaccine. You may need this if you have certain conditions or if you travel or work in places where you may be exposed to hepatitis A.  Hepatitis B vaccine. You may need this if you have certain conditions or if you travel or work in places where you may be exposed to hepatitis B.  Haemophilus influenzae type b (Hib) vaccine. You may need this if you have certain conditions.  Talk to your health care provider about which screenings and vaccines you need and how often you need them. This information is not intended to replace advice given to you by your health care provider. Make sure you discuss any questions you have with your health care provider. Document Released: 12/21/2015 Document Revised: 08/13/2016 Document Reviewed: 09/25/2015 Elsevier Interactive Patient Education  2017 [place] K. Cornie Mccomber M.D.

## 2017-09-23 ENCOUNTER — Encounter: Payer: Self-pay | Admitting: Internal Medicine

## 2017-09-23 ENCOUNTER — Ambulatory Visit (INDEPENDENT_AMBULATORY_CARE_PROVIDER_SITE_OTHER): Payer: Medicare Other | Admitting: Internal Medicine

## 2017-09-23 VITALS — BP 124/80 | HR 89 | Temp 97.7°F | Ht 65.75 in | Wt 202.8 lb

## 2017-09-23 DIAGNOSIS — M8580 Other specified disorders of bone density and structure, unspecified site: Secondary | ICD-10-CM | POA: Diagnosis not present

## 2017-09-23 DIAGNOSIS — D582 Other hemoglobinopathies: Secondary | ICD-10-CM

## 2017-09-23 DIAGNOSIS — Z23 Encounter for immunization: Secondary | ICD-10-CM | POA: Diagnosis not present

## 2017-09-23 DIAGNOSIS — Z0000 Encounter for general adult medical examination without abnormal findings: Secondary | ICD-10-CM | POA: Diagnosis not present

## 2017-09-23 DIAGNOSIS — E785 Hyperlipidemia, unspecified: Secondary | ICD-10-CM | POA: Diagnosis not present

## 2017-09-23 MED ORDER — VALACYCLOVIR HCL 1 G PO TABS: 2000.0000 mg | ORAL_TABLET | Freq: Two times a day (BID) | ORAL | 3 refills | Status: DC

## 2017-09-23 MED ORDER — ZOSTER VAC RECOMB ADJUVANTED 50 MCG/0.5ML IM SUSR: 0.5000 mL | Freq: Once | INTRAMUSCULAR | 1 refills | Status: AC

## 2017-09-23 NOTE — Patient Instructions (Addendum)
Continue lifestyle intervention healthy eating and exercise . Try stopping evening snacks and     Breads .   For  3 weeks .   See if helps.  cologuard due every 3 years   2020  shingrix at pharmacy.       Preventive Care 69 Years and Older, Female Preventive care refers to lifestyle choices and visits with your health care provider that can promote health and wellness. What does preventive care include?  A yearly physical exam. This is also called an annual well check.  Dental exams once or twice a year.  Routine eye exams. Ask your health care provider how often you should have your eyes checked.  Personal lifestyle choices, including: ? Daily care of your teeth and gums. ? Regular physical activity. ? Eating a healthy diet. ? Avoiding tobacco and drug use. ? Limiting alcohol use. ? Practicing safe sex. ? Taking low-dose aspirin every day. ? Taking vitamin and mineral supplements as recommended by your health care provider. What happens during an annual well check? The services and screenings done by your health care provider during your annual well check will depend on your age, overall health, lifestyle risk factors, and family history of disease. Counseling Your health care provider may ask you questions about your:  Alcohol use.  Tobacco use.  Drug use.  Emotional well-being.  Home and relationship well-being.  Sexual activity.  Eating habits.  History of falls.  Memory and ability to understand (cognition).  Work and work Statistician.  Reproductive health.  Screening You may have the following tests or measurements:  Height, weight, and BMI.  Blood pressure.  Lipid and cholesterol levels. These may be checked every 5 years, or more frequently if you are over 69 years old.  Skin check.  Lung cancer screening. You may have this screening every year starting at age 21 if you have a 30-pack-year history of smoking and currently smoke or have quit  within the past 15 years.  Fecal occult blood test (FOBT) of the stool. You may have this test every year starting at age 69.  Flexible sigmoidoscopy or colonoscopy. You may have a sigmoidoscopy every 5 years or a colonoscopy every 10 years starting at age 59.  Hepatitis C blood test.  Hepatitis B blood test.  Sexually transmitted disease (STD) testing.  Diabetes screening. This is done by checking your blood sugar (glucose) after you have not eaten for a while (fasting). You may have this done every 1-3 years.  Bone density scan. This is done to screen for osteoporosis. You may have this done starting at age 45.  Mammogram. This may be done every 1-2 years. Talk to your health care provider about how often you should have regular mammograms.  Talk with your health care provider about your test results, treatment options, and if necessary, the need for more tests. Vaccines Your health care provider may recommend certain vaccines, such as:  Influenza vaccine. This is recommended every year.  Tetanus, diphtheria, and acellular pertussis (Tdap, Td) vaccine. You may need a Td booster every 10 years.  Varicella vaccine. You may need this if you have not been vaccinated.  Zoster vaccine. You may need this after age 17.  Measles, mumps, and rubella (MMR) vaccine. You may need at least one dose of MMR if you were born in 1957 or later. You may also need a second dose.  Pneumococcal 13-valent conjugate (PCV13) vaccine. One dose is recommended after age 13.  Pneumococcal  polysaccharide (PPSV23) vaccine. One dose is recommended after age 14.  Meningococcal vaccine. You may need this if you have certain conditions.  Hepatitis A vaccine. You may need this if you have certain conditions or if you travel or work in places where you may be exposed to hepatitis A.  Hepatitis B vaccine. You may need this if you have certain conditions or if you travel or work in places where you may be exposed  to hepatitis B.  Haemophilus influenzae type b (Hib) vaccine. You may need this if you have certain conditions.  Talk to your health care provider about which screenings and vaccines you need and how often you need them. This information is not intended to replace advice given to you by your health care provider. Make sure you discuss any questions you have with your health care provider. Document Released: 12/21/2015 Document Revised: 08/13/2016 Document Reviewed: 09/25/2015 Elsevier Interactive Patient Education  2017 [employer].

## 2017-09-24 LAB — HEPATIC FUNCTION PANEL
ALT: 25 U/L (ref 0–35)
AST: 21 U/L (ref 0–37)
Albumin: 4.5 g/dL (ref 3.5–5.2)
Alkaline Phosphatase: 78 U/L (ref 39–117)
Bilirubin, Direct: 0.1 mg/dL (ref 0.0–0.3)
TOTAL PROTEIN: 6.7 g/dL (ref 6.0–8.3)
Total Bilirubin: 0.7 mg/dL (ref 0.2–1.2)

## 2017-09-24 LAB — CBC WITH DIFFERENTIAL/PLATELET
BASOS PCT: 0.6 % (ref 0.0–3.0)
Basophils Absolute: 0 10*3/uL (ref 0.0–0.1)
Eosinophils Absolute: 0 10*3/uL (ref 0.0–0.7)
Eosinophils Relative: 0.6 % (ref 0.0–5.0)
HEMATOCRIT: 45.1 % (ref 36.0–46.0)
HEMOGLOBIN: 15 g/dL (ref 12.0–15.0)
LYMPHS PCT: 34 % (ref 12.0–46.0)
Lymphs Abs: 2.1 10*3/uL (ref 0.7–4.0)
MCHC: 33.3 g/dL (ref 30.0–36.0)
MCV: 83.7 fl (ref 78.0–100.0)
MONOS PCT: 8 % (ref 3.0–12.0)
Monocytes Absolute: 0.5 10*3/uL (ref 0.1–1.0)
NEUTROS ABS: 3.5 10*3/uL (ref 1.4–7.7)
Neutrophils Relative %: 56.8 % (ref 43.0–77.0)
PLATELETS: 205 10*3/uL (ref 150.0–400.0)
RBC: 5.39 Mil/uL — ABNORMAL HIGH (ref 3.87–5.11)
RDW: 13.6 % (ref 11.5–15.5)
WBC: 6.2 10*3/uL (ref 4.0–10.5)

## 2017-09-24 LAB — BASIC METABOLIC PANEL
BUN: 18 mg/dL (ref 6–23)
CHLORIDE: 102 meq/L (ref 96–112)
CO2: 29 meq/L (ref 19–32)
CREATININE: 0.63 mg/dL (ref 0.40–1.20)
Calcium: 9.3 mg/dL (ref 8.4–10.5)
GFR: 99.63 mL/min (ref >60.00)
Glucose, Bld: 87 mg/dL (ref 70–99)
POTASSIUM: 3.9 meq/L (ref 3.5–5.1)
SODIUM: 139 meq/L (ref 135–145)

## 2017-09-24 LAB — LIPID PANEL
CHOL/HDL RATIO: 5
Cholesterol: 238 mg/dL — ABNORMAL HIGH (ref 0–200)
HDL: 46.8 mg/dL (ref >39.00)
LDL Cholesterol: 155 mg/dL — ABNORMAL HIGH (ref 0–99)
NonHDL: 190.8
TRIGLYCERIDES: 180 mg/dL — AB (ref 0.0–149.0)
VLDL: 36 mg/dL (ref 0.0–40.0)

## 2017-09-24 LAB — TSH: TSH: 1.02 u[IU]/mL (ref 0.35–4.50)

## 2017-12-08 HISTORY — PX: EYE SURGERY: SHX253

## 2018-01-11 ENCOUNTER — Other Ambulatory Visit: Payer: Self-pay | Admitting: Internal Medicine

## 2018-01-11 DIAGNOSIS — Z139 Encounter for screening, unspecified: Secondary | ICD-10-CM

## 2018-01-26 ENCOUNTER — Other Ambulatory Visit: Payer: Medicare Other

## 2018-02-17 ENCOUNTER — Encounter: Payer: Self-pay | Admitting: Internal Medicine

## 2018-02-22 ENCOUNTER — Ambulatory Visit: Payer: Medicare Other

## 2018-02-23 ENCOUNTER — Ambulatory Visit
Admission: RE | Admit: 2018-02-23 | Discharge: 2018-02-23 | Disposition: A | Discharge status: Home / Self Care | Payer: Medicare Other | Source: Ambulatory Visit | Attending: Internal Medicine | Admitting: Internal Medicine

## 2018-02-23 DIAGNOSIS — Z139 Encounter for screening, unspecified: Secondary | ICD-10-CM

## 2018-03-03 ENCOUNTER — Encounter: Payer: Self-pay | Admitting: Internal Medicine

## 2018-03-04 NOTE — Telephone Encounter (Signed)
Spoke with patient, nothing needed - personal matter already handled.

## 2018-03-08 ENCOUNTER — Other Ambulatory Visit: Payer: Self-pay | Admitting: Internal Medicine

## 2018-03-08 DIAGNOSIS — E785 Hyperlipidemia, unspecified: Secondary | ICD-10-CM

## 2018-03-09 ENCOUNTER — Other Ambulatory Visit (INDEPENDENT_AMBULATORY_CARE_PROVIDER_SITE_OTHER): Payer: Medicare Other

## 2018-03-09 DIAGNOSIS — E785 Hyperlipidemia, unspecified: Secondary | ICD-10-CM

## 2018-03-09 LAB — LIPID PANEL
CHOLESTEROL: 183 mg/dL (ref 0–200)
HDL: 42.8 mg/dL (ref >39.00)
LDL Cholesterol: 118 mg/dL — ABNORMAL HIGH (ref 0–99)
NonHDL: 140.24
TRIGLYCERIDES: 109 mg/dL (ref 0.0–149.0)
Total CHOL/HDL Ratio: 4
VLDL: 21.8 mg/dL (ref 0.0–40.0)

## 2018-10-20 ENCOUNTER — Telehealth: Payer: Self-pay | Admitting: Internal Medicine

## 2018-10-20 NOTE — Telephone Encounter (Signed)
Not sure what to do with this data  If having a  Symptom of  Poor circulation can do a  Vascular  Evaluation.

## 2018-10-20 NOTE — Telephone Encounter (Signed)
[facility] with recommendations. Advised to call back if any further questions. Nothing further needed.

## 2018-10-20 NOTE — Telephone Encounter (Signed)
Please advise Dr Panosh, thanks.   

## 2018-10-20 NOTE — Telephone Encounter (Signed)
Copied from CRM [id number]. Topic: Quick Communication - See Telephone Encounter >> Oct 20, 2018 12:40 PM Floria RavelingStovall, Shana A wrote: CRM for notification. See Telephone encounter for: 10/20/18.  Heather with [facility] called in and stated they did a quanta flow test and the left lower Extremity show a moderate decrease at 0.85 and the right was mild at 0.97.  This was just a fyi to the DR   [phone]

## 2018-11-15 NOTE — Progress Notes (Signed)
Chief Complaint  Patient presents with  . Anxiety    Increased anxiety - wants to start back on Lexapro    HPI: Heather Lamb 72 y.o. come in for sda    December is difficult month and holidays of many losses and anxiety     Many anniversary s  Sis has  Kidney cancer. New dx lexapro 10 mg .    Had to put down  9219 y old dog and sad  with his  Exercising   cotober and till now .  And   Restarted lexapro 3 weeks ago and [facility] and  Other  Was on med in 2016 and off after about a year .  No change in physical health    Feels physically well otherwise  ROS: See pertinent positives and negatives per HPI.  Past Medical History:  Diagnosis Date  . Allergy   . Anxiety   . Epiglottitis 11/13/2015  . History of UTI   . Kidney stones   . Panic attacks 08/23/2014  . Right lower lobe pneumonia (HCC) 10/11/2011  . Supraglottitis 11/13/2015    Family History  Problem Relation Age of Onset  . Arthritis Father   . Heart disease Father   . Hypertension Unknown        parent  . Stroke Unknown   . Hyperlipidemia Unknown     Social History   Socioeconomic History  . Marital status: Married    Spouse name: Not on file  . Number of children: Not on file  . Years of education: Not on file  . Highest education level: Not on file  Occupational History  . Not on file  Social Needs  . Financial resource strain: Not on file  . Food insecurity:    Worry: Not on file    Inability: Not on file  . Transportation needs:    Medical: Not on file    Non-medical: Not on file  Tobacco Use  . Smoking status: Never Smoker  . Smokeless tobacco: Never Used  Substance and Sexual Activity  . Alcohol use: No  . Drug use: No  . Sexual activity: Not on file  Lifestyle  . Physical activity:    Days per week: Not on file    Minutes per session: Not on file  . Stress: Not on file  Relationships  . Social connections:    Talks on phone: Not on file    Gets together: Not on file   Attends religious service: Not on file    Active member of club or organization: Not on file    Attends meetings of clubs or organizations: Not on file    Relationship status: Not on file  Other Topics Concern  . Not on file  Social History Narrative   HHof 2    Retired Investment banker, operationalaMAth teacher    BS degree   g4 p4    Neg tad husband smokes but no ets.     Outpatient Medications Prior to Visit  Medication Sig Dispense Refill  . calcium carbonate (OS-CAL) 600 MG TABS tablet Take 600 mg by mouth 2 (two) times daily with a meal. With vit D3    . Cholecalciferol (VITAMIN D PO) Take 1 tablet by mouth daily.    . valACYclovir (VALTREX) 1000 MG tablet Take 2 tablets (2,000 mg total) by mouth 2 (two) times daily. For cold sores. 30 tablet 3   No facility-administered medications prior to visit.  EXAM:  BP 108/70 (BP Location: Right Arm, Patient Position: Sitting, Cuff Size: Normal)   Pulse 75   Temp 98.2 F (36.8 C) (Oral)   Wt 198 lb (89.8 kg)   BMI 32.20 kg/m   Body mass index is 32.2 kg/m.  GENERAL: vitals reviewed and listed above, alert, oriented, appears well hydrated and in no acute distress HEENT: atraumatic, conjunctiva  clear, no obvious abnormalities on inspection of external nose and ears OP : no lesion edema or exudate  NECK: no obvious masses on inspection palpation  LUNGS: clear to auscultation bilaterally, no wheezes, rales or rhonchi, good air movement CV: HRRR, no clubbing cyanosis or  peripheral edema nl cap refill  MS: moves all extremities without noticeable focal  abnormality PSYCH: pleasant and cooperative, no obvious depression or anxiety Lab Results  Component Value Date   WBC 6.2 09/23/2017   HGB 15.0 09/23/2017   HCT 45.1 09/23/2017   PLT 205.0 09/23/2017   GLUCOSE 87 09/23/2017   CHOL 183 03/09/2018   TRIG 109.0 03/09/2018   HDL 42.80 03/09/2018   LDLCALC 118 (H) 03/09/2018   ALT 25 09/23/2017   AST 21 09/23/2017   NA 139 09/23/2017   K 3.9  09/23/2017   CL 102 09/23/2017   CREATININE 0.63 09/23/2017   BUN 18 09/23/2017   CO2 29 09/23/2017   TSH 1.02 09/23/2017   HGBA1C 5.9 04/30/2016   BP Readings from Last 3 Encounters:  11/16/18 108/70  09/23/17 124/80  04/30/16 (!) 142/96   Wt Readings from Last 3 Encounters:  11/16/18 198 lb (89.8 kg)  09/23/17 202 lb 12.8 oz (92 kg)  04/30/16 199 lb 4 oz (90.4 kg)    ASSESSMENT AND PLAN:  Discussed the following assessment and plan:  Anxiety - external factors contributin and has had good respinse to lexapro in past  counseled and rx for 6 months fy at [facility] in 3-6 nmonth or as needed  Influenza vaccination declined by patient No high alarm  Sx  -Patient advised to return or notify health care team  if  new concerns arise.  Patient Instructions  Agree with restart  Of med and advise stay on for  Minimum 9-12 mos if doing ok  Or longer .  Plan rov in 3-6 month about how doing and or     Burna Mortimer K. Jourdan Maldonado M.D.

## 2018-11-16 ENCOUNTER — Ambulatory Visit: Payer: Medicare Other | Admitting: Internal Medicine

## 2018-11-16 ENCOUNTER — Encounter: Payer: Self-pay | Admitting: Internal Medicine

## 2018-11-16 VITALS — BP 108/70 | HR 75 | Temp 98.2°F | Wt 198.0 lb

## 2018-11-16 DIAGNOSIS — F419 Anxiety disorder, unspecified: Secondary | ICD-10-CM

## 2018-11-16 DIAGNOSIS — Z2821 Immunization not carried out because of patient refusal: Secondary | ICD-10-CM

## 2018-11-16 MED ORDER — ESCITALOPRAM OXALATE 10 MG PO TABS: 10.0000 mg | ORAL_TABLET | Freq: Every day | ORAL | 1 refills | Status: DC

## 2018-11-16 NOTE — Patient Instructions (Signed)
Agree with restart  Of med and advise stay on for  Minimum 9-12 mos if doing ok  Or longer .  Plan rov in 3-6 month about how doing and or

## 2019-01-19 ENCOUNTER — Other Ambulatory Visit: Payer: Self-pay | Admitting: Internal Medicine

## 2019-01-19 DIAGNOSIS — Z1231 Encounter for screening mammogram for malignant neoplasm of breast: Secondary | ICD-10-CM

## 2019-01-20 ENCOUNTER — Telehealth: Payer: Self-pay

## 2019-01-20 NOTE — Telephone Encounter (Signed)
Author phoned pt. to schedule AWV. Appointment made for 2/19.  Copied from CRM [id number]. Topic: Medicare AWV >> Jan 19, 2019 11:02 AM Lynne Logan D wrote: Reason for CRM: Pt called to schedule her AWV. Please advise [phone]

## 2019-01-25 NOTE — Progress Notes (Addendum)
Subjective:   Heather Lamb is a 73 y.o. female who presents for Medicare Annual (Subsequent) preventive examination.  Review of Systems:  No ROS.  Medicare Wellness Visit. Additional risk factors are reflected in the social history.  Cardiac Risk Factors include: obesity (BMI >30kg/m2);advanced age (>14men, >54 women) Sleep patterns: feels rested on waking and does not get up to void.  Some occassional incontinence with cough. Author reviewed kegel exercises that patient could incorporate into exercise routine, but does not upset sleep.  Home Safety/Smoke Alarms: Feels safe in home. Smoke alarms in place.  Living environment; residence and Firearm Safety: 2-story house. No use or need for DME at this time.  Seat Belt Safety/Bike Helmet: Wears seat belt.   Female:   Pap- N/A d/t age       Mammo- 02/23/2018, scheduled for 02/22/2019.  May need to reschedule for after 02/24/2019 for insurance coverage purposes. Pt. to call and check.   Dexa scan-  05/2016, overdue. Will order.       [facility]- Colonoscopy 10/2006. 03/24/2016; Cologuard ordered     Objective:     Vitals: BP (!) 142/76 (BP Location: Right Arm, Patient Position: Sitting, Cuff Size: Normal)   Pulse 85   Temp 98.1 F (36.7 C)   Resp 14   Ht 5\' 6"  (1.676 m)   Wt 190 lb (86.2 kg)   SpO2 98%   BMI 30.67 kg/m   Body mass index is 30.67 kg/m.  Advanced Directives 01/26/2019 11/13/2015 11/13/2015  Does Patient Have a Medical Advance Directive? Yes Yes No  Type of Estate agent of [place];Living will Healthcare Power of [place];Living will -  Does patient want to make changes to medical advance directive? No - Patient declined No - Patient declined -  Copy of Healthcare Power of Attorney in Chart? Yes - validated most recent copy scanned in chart (See row information) Yes -    Tobacco Social History   Tobacco Use  Smoking Status Never Smoker  Smokeless Tobacco Never Used     Counseling given: Not  Answered   Past Medical History:  Diagnosis Date  . Allergy   . Anxiety   . Epiglottitis 11/13/2015  . History of UTI   . Kidney stones   . Panic attacks 08/23/2014  . Right lower lobe pneumonia (HCC) 10/11/2011  . Supraglottitis 11/13/2015   Past Surgical History:  Procedure Laterality Date  . EYE SURGERY  2019   cataract removal Feb and March  . NO PAST SURGERIES    . TUBAL LIGATION  1988   Family History  Problem Relation Age of Onset  . Arthritis Father   . Heart disease Father   . Hypertension Other        parent  . Stroke Other   . Hyperlipidemia Other    Social History   Socioeconomic History  . Marital status: Married    Spouse name: Not on file  . Number of children: 4  . Years of education: Not on file  . Highest education level: Not on file  Occupational History  . Occupation: Editor, commissioning    Comment: retired  Engineer, production  . Financial resource strain: Not hard at all  . Food insecurity:    Worry: Never true    Inability: Never true  . Transportation needs:    Medical: No    Non-medical: No  Tobacco Use  . Smoking status: Never Smoker  . Smokeless tobacco: Never Used  Substance and Sexual  Activity  . Alcohol use: No  . Drug use: No  . Sexual activity: Not on file  Lifestyle  . Physical activity:    Days per week: 5 days    Minutes per session: 90 min  . Stress: Only a little  Relationships  . Social connections:    Talks on phone: More than three times a week    Gets together: Twice a week    Attends religious service: Not on file    Active member of club or organization: Not on file    Attends meetings of clubs or organizations: Not on file    Relationship status: Married  Other Topics Concern  . Not on file  Social History Narrative   HHof 2    Retired Investment banker, operational degree   g4 p4    Neg tad husband smokes but no ets.       01/26/19: Lives with husband in 2 story house   Has two daughters, two sons, all of whom live locally  with grandkids. Active in their care.   Exercises at [facility] doing elliptical, bike, weight training        Outpatient Encounter Medications as of 01/26/2019  Medication Sig  . calcium carbonate (OS-CAL) 600 MG TABS tablet Take 600 mg by mouth 2 (two) times daily with a meal. With vit D3  . Cholecalciferol (VITAMIN D PO) Take 1 tablet by mouth daily.  Marland Kitchen escitalopram (LEXAPRO) 10 MG tablet Take 1 tablet (10 mg total) by mouth daily.  . valACYclovir (VALTREX) 1000 MG tablet Take 2 tablets (2,000 mg total) by mouth 2 (two) times daily. For cold sores.   No facility-administered encounter medications on file as of 01/26/2019.     Activities of Daily Living In your present state of health, do you have any difficulty performing the following activities: 01/26/2019  Hearing? N  Vision? N  Difficulty concentrating or making decisions? N  Walking or climbing stairs? N  Dressing or bathing? N  Doing errands, shopping? N  Preparing Food and eating ? N  Using the Toilet? N  In the past six months, have you accidently leaked urine? Y  Comment author reviewed kegel exercises  Do you have problems with loss of bowel control? N  Managing your Medications? N  Managing your Finances? N  Housekeeping or managing your Housekeeping? N  Some recent data might be hidden    Patient Care Team: Panosh, Neta Mends, MD as PCP - General (Internal Medicine) Nita Sells, MD (Dermatology) Serena Colonel, MD as Consulting Physician (Otolaryngology) Daisy Lazar, DO (Optometry)    Assessment:   This is a routine wellness examination for Heather Lamb. Physical assessment deferred to PCP.    Exercise Activities and Dietary recommendations Current Exercise Habits: Structured exercise class;Home exercise routine, Type of exercise: walking;strength training/weights(elliptical, bike), Time (Minutes): 60, Frequency (Times/Week): 5, Weekly Exercise (Minutes/Week): 300, Intensity: Intense Diet (meal preparation, eat out, water  intake, caffeinated beverages, dairy products, fruits and vegetables): in general, a "healthy" diet  , eats a lot of vegetables, watches carbohydrate intake. Pt. Disappointed that she has not lost more weight with exercise and diet change, although pt. is steadily losing. Different strategies discussed and author reassured pt. that metabolism slows as we age and it is harder to lose weight. Author encouraged pt. to keep up with routine and stay hydrated.   Goals    . Patient Stated     Lose more weight to get to goal of  175lbs by next year!       Fall Risk Fall Risk  01/26/2019 09/23/2017 04/30/2016 04/24/2015 03/08/2014  Falls in the past year? 0 No No No No     Depression Screen PHQ 2/9 Scores 01/26/2019 09/23/2017 04/30/2016 04/24/2015  PHQ - 2 Score 0 0 0 0  PHQ- 9 Score 0 - - -   Pt. States anxiety is well controlled with lexapro.   Cognitive Function       Ad8 score reviewed for issues:  Issues making decisions: no  Less interest in hobbies / activities: no  Repeats questions, stories (family complaining): no  Trouble using ordinary gadgets (microwave, computer, phone):no  Forgets the month or year: no  Mismanaging finances: no  Remembering appts: no  Daily problems with thinking and/or memory: no Ad8 score is= 0  Immunization History  Administered Date(s) Administered  . Pneumococcal Conjugate-13 03/08/2014  . Pneumococcal Polysaccharide-23 04/24/2015  . Td 09/23/2017  . Zoster 10/06/2012  . Zoster Recombinat (Shingrix) 10/01/2017, 10/31/2017, 12/07/2017    Qualifies for Shingles Vaccine? Already received full series.  Screening Tests Health Maintenance  Topic Date Due  . COLON CANCER SCREENING ANNUAL FOBT  03/08/2019 (Originally 03/24/2017)  . INFLUENZA VACCINE  03/09/2019 (Originally 07/08/2018)  . MAMMOGRAM  02/24/2020  . TETANUS/TDAP  09/24/2027  . DEXA SCAN  Completed  . Hepatitis C Screening  Completed  . PNA vac Low Risk Adult  Completed  .  COLONOSCOPY  Discontinued        Plan:    Continue with your exercise and diet regimine, you're doing awesome!  Follow up on rescheduling mammogram and getting bone density scan done simultaneously.  Cologuard order placed.  I have personally reviewed and noted the following in the patient's chart:   . Medical and social history . Use of alcohol, tobacco or illicit drugs  . Current medications and supplements . Functional ability and status . Nutritional status . Physical activity . Advanced directives . List of other physicians . Vitals . Screenings to include cognitive, depression, and falls . Referrals and appointments  In addition, I have reviewed and discussed with patient certain preventive protocols, quality metrics, and best practice recommendations. A written personalized care plan for preventive services as well as general preventive health recommendations were provided to patient.     Brayton LaymanEmily R , RN  01/26/2019   Above noted reviewed and agree. Berniece AndreasWanda Panosh, MD

## 2019-01-26 ENCOUNTER — Ambulatory Visit (INDEPENDENT_AMBULATORY_CARE_PROVIDER_SITE_OTHER): Payer: Medicare Other

## 2019-01-26 VITALS — BP 142/76 | HR 85 | Temp 98.1°F | Resp 14 | Ht 66.0 in | Wt 190.0 lb

## 2019-01-26 DIAGNOSIS — Z0000 Encounter for general adult medical examination without abnormal findings: Secondary | ICD-10-CM

## 2019-01-26 DIAGNOSIS — Z13820 Encounter for screening for osteoporosis: Secondary | ICD-10-CM

## 2019-01-26 DIAGNOSIS — Z1211 Encounter for screening for malignant neoplasm of colon: Secondary | ICD-10-CM

## 2019-01-26 NOTE — Patient Instructions (Addendum)
Continue with your exercise and diet regimine, you're doing awesome!  Follow up on rescheduling mammogram and getting bone density scan done simultaneously.  Cologuard order placed.   Heather Lamb , Thank you for taking time to come for your Medicare Wellness Visit. I appreciate your ongoing commitment to your health goals. Please review the following plan we discussed and let me know if I can assist you in the future.   These are the goals we discussed: Goals    . Patient Stated     Lose more weight to get to goal of 175lbs by next year!       This is a list of the screening recommended for you and due dates:  Health Maintenance  Topic Date Due  . Stool Blood Test  03/24/2017  . Flu Shot  03/09/2019*  . Mammogram  02/24/2020  . Tetanus Vaccine  09/24/2027  . DEXA scan (bone density measurement)  Completed  .  Hepatitis C: One time screening is recommended by Center for Disease Control  (CDC) for  adults born from [birth year] through 1963.   Completed  . Pneumonia vaccines  Completed  . Colon Cancer Screening  Discontinued  *Topic was postponed. The date shown is not the original due date.    Health Maintenance, Female Adopting a healthy lifestyle and getting preventive care can go a long way to promote health and wellness. Talk with your health care provider about what schedule of regular examinations is right for you. This is a good chance for you to check in with your provider about disease prevention and staying healthy. In between checkups, there are plenty of things you can do on your own. Experts have done a lot of research about which lifestyle changes and preventive measures are most likely to keep you healthy. Ask your health care provider for more information. Weight and diet Eat a healthy diet  Be sure to include plenty of vegetables, fruits, low-fat dairy products, and lean protein.  Do not eat a lot of foods high in solid fats, added sugars, or salt.  Get regular  exercise. This is one of the most important things you can do for your health. ? Most adults should exercise for at least 150 minutes each week. The exercise should increase your heart rate and make you sweat (moderate-intensity exercise). ? Most adults should also do strengthening exercises at least twice a week. This is in addition to the moderate-intensity exercise. Maintain a healthy weight  Body mass index (BMI) is a measurement that can be used to identify possible weight problems. It estimates body fat based on height and weight. Your health care provider can help determine your BMI and help you achieve or maintain a healthy weight.  For females 63 years of age and older: ? A BMI below 18.5 is considered underweight. ? A BMI of 18.5 to 24.9 is normal. ? A BMI of 25 to 29.9 is considered overweight. ? A BMI of 30 and above is considered obese. Watch levels of cholesterol and blood lipids  You should start having your blood tested for lipids and cholesterol at 71 years of age, then have this test every 5 years.  You may need to have your cholesterol levels checked more often if: ? Your lipid or cholesterol levels are high. ? You are older than 71 years of age. ? You are at high risk for heart disease. Cancer screening Lung Cancer  Lung cancer screening is recommended for adults 31-28 years old  who are at high risk for lung cancer because of a history of smoking.  A yearly low-dose CT scan of the lungs is recommended for people who: ? Currently smoke. ? Have quit within the past 15 years. ? Have at least a 30-pack-year history of smoking. A pack year is smoking an average of one pack of cigarettes a day for 1 year.  Yearly screening should continue until it has been 15 years since you quit.  Yearly screening should stop if you develop a health problem that would prevent you from having lung cancer treatment. Breast Cancer  Practice breast self-awareness. This means  understanding how your breasts normally appear and feel.  It also means doing regular breast self-exams. Let your health care provider know about any changes, no matter how small.  If you are in your 20s or 30s, you should have a clinical breast exam (CBE) by a health care provider every 1-3 years as part of a regular health exam.  If you are 22 or older, have a CBE every year. Also consider having a breast X-ray (mammogram) every year.  If you have a family history of breast cancer, talk to your health care provider about genetic screening.  If you are at high risk for breast cancer, talk to your health care provider about having an MRI and a mammogram every year.  Breast cancer gene (BRCA) assessment is recommended for women who have family members with BRCA-related cancers. BRCA-related cancers include: ? Breast. ? Ovarian. ? Tubal. ? Peritoneal cancers.  Results of the assessment will determine the need for genetic counseling and BRCA1 and BRCA2 testing. Cervical Cancer Your health care provider may recommend that you be screened regularly for cancer of the pelvic organs (ovaries, uterus, and vagina). This screening involves a pelvic examination, including checking for microscopic changes to the surface of your cervix (Pap test). You may be encouraged to have this screening done every 3 years, beginning at age 39.  For women ages 42-65, health care providers may recommend pelvic exams and Pap testing every 3 years, or they may recommend the Pap and pelvic exam, combined with testing for human papilloma virus (HPV), every 5 years. Some types of HPV increase your risk of cervical cancer. Testing for HPV may also be done on women of any age with unclear Pap test results.  Other health care providers may not recommend any screening for nonpregnant women who are considered low risk for pelvic cancer and who do not have symptoms. Ask your health care provider if a screening pelvic exam is right  for you.  If you have had past treatment for cervical cancer or a condition that could lead to cancer, you need Pap tests and screening for cancer for at least 20 years after your treatment. If Pap tests have been discontinued, your risk factors (such as having a new sexual partner) need to be reassessed to determine if screening should resume. Some women have medical problems that increase the chance of getting cervical cancer. In these cases, your health care provider may recommend more frequent screening and Pap tests. Colorectal Cancer  This type of cancer can be detected and often prevented.  Routine colorectal cancer screening usually begins at 71 years of age and continues through 71 years of age.  Your health care provider may recommend screening at an earlier age if you have risk factors for colon cancer.  Your health care provider may also recommend using home test kits to check for  hidden blood in the stool.  A small camera at the end of a tube can be used to examine your colon directly (sigmoidoscopy or colonoscopy). This is done to check for the earliest forms of colorectal cancer.  Routine screening usually begins at age 58.  Direct examination of the colon should be repeated every 5-10 years through 71 years of age. However, you may need to be screened more often if early forms of precancerous polyps or small growths are found. Skin Cancer  Check your skin from head to toe regularly.  Tell your health care provider about any new moles or changes in moles, especially if there is a change in a mole's shape or color.  Also tell your health care provider if you have a mole that is larger than the size of a pencil eraser.  Always use sunscreen. Apply sunscreen liberally and repeatedly throughout the day.  Protect yourself by wearing long sleeves, pants, a wide-brimmed hat, and sunglasses whenever you are outside. Heart disease, diabetes, and high blood pressure  High blood  pressure causes heart disease and increases the risk of stroke. High blood pressure is more likely to develop in: ? People who have blood pressure in the high end of the normal range (130-139/85-89 mm Hg). ? People who are overweight or obese. ? People who are African American.  If you are 45-12 years of age, have your blood pressure checked every 3-5 years. If you are 16 years of age or older, have your blood pressure checked every year. You should have your blood pressure measured twice-once when you are at a hospital or clinic, and once when you are not at a hospital or clinic. Record the average of the two measurements. To check your blood pressure when you are not at a hospital or clinic, you can use: ? An automated blood pressure machine at a pharmacy. ? A home blood pressure monitor.  If you are between 81 years and 82 years old, ask your health care provider if you should take aspirin to prevent strokes.  Have regular diabetes screenings. This involves taking a blood sample to check your fasting blood sugar level. ? If you are at a normal weight and have a low risk for diabetes, have this test once every three years after 71 years of age. ? If you are overweight and have a high risk for diabetes, consider being tested at a younger age or more often. Preventing infection Hepatitis B  If you have a higher risk for hepatitis B, you should be screened for this virus. You are considered at high risk for hepatitis B if: ? You were born in a country where hepatitis B is common. Ask your health care provider which countries are considered high risk. ? Your parents were born in a high-risk country, and you have not been immunized against hepatitis B (hepatitis B vaccine). ? You have HIV or AIDS. ? You use needles to inject street drugs. ? You live with someone who has hepatitis B. ? You have had sex with someone who has hepatitis B. ? You get hemodialysis treatment. ? You take certain  medicines for conditions, including cancer, organ transplantation, and autoimmune conditions. Hepatitis C  Blood testing is recommended for: ? Everyone born from [birth year] through 1963. ? Anyone with known risk factors for hepatitis C. Sexually transmitted infections (STIs)  You should be screened for sexually transmitted infections (STIs) including gonorrhea and chlamydia if: ? You are sexually active and are younger  than 71 years of age. ? You are older than 71 years of age and your health care provider tells you that you are at risk for this type of infection. ? Your sexual activity has changed since you were last screened and you are at an increased risk for chlamydia or gonorrhea. Ask your health care provider if you are at risk.  If you do not have HIV, but are at risk, it may be recommended that you take a prescription medicine daily to prevent HIV infection. This is called pre-exposure prophylaxis (PrEP). You are considered at risk if: ? You are sexually active and do not regularly use condoms or know the HIV status of your partner(s). ? You take drugs by injection. ? You are sexually active with a partner who has HIV. Talk with your health care provider about whether you are at high risk of being infected with HIV. If you choose to begin PrEP, you should first be tested for HIV. You should then be tested every 3 months for as long as you are taking PrEP. Pregnancy  If you are premenopausal and you may become pregnant, ask your health care provider about preconception counseling.  If you may become pregnant, take 400 to 800 micrograms (mcg) of folic acid every day.  If you want to prevent pregnancy, talk to your health care provider about birth control (contraception). Osteoporosis and menopause  Osteoporosis is a disease in which the bones lose minerals and strength with aging. This can result in serious bone fractures. Your risk for osteoporosis can be identified using a bone density  scan.  If you are 54 years of age or older, or if you are at risk for osteoporosis and fractures, ask your health care provider if you should be screened.  Ask your health care provider whether you should take a calcium or vitamin D supplement to lower your risk for osteoporosis.  Menopause may have certain physical symptoms and risks.  Hormone replacement therapy may reduce some of these symptoms and risks. Talk to your health care provider about whether hormone replacement therapy is right for you. Follow these instructions at home:  Schedule regular health, dental, and eye exams.  Stay current with your immunizations.  Do not use any tobacco products including cigarettes, chewing tobacco, or electronic cigarettes.  If you are pregnant, do not drink alcohol.  If you are breastfeeding, limit how much and how often you drink alcohol.  Limit alcohol intake to no more than 1 drink per day for nonpregnant women. One drink equals 12 ounces of beer, 5 ounces of wine, or 1 ounces of hard liquor.  Do not use street drugs.  Do not share needles.  Ask your health care provider for help if you need support or information about quitting drugs.  Tell your health care provider if you often feel depressed.  Tell your health care provider if you have ever been abused or do not feel safe at home. This information is not intended to replace advice given to you by your health care provider. Make sure you discuss any questions you have with your health care provider. Document Released: 06/09/2011 Document Revised: 05/01/2016 Document Reviewed: 08/28/2015 Elsevier Interactive Patient Education  2019 [employer].

## 2019-02-22 ENCOUNTER — Ambulatory Visit: Payer: Medicare Other

## 2019-04-04 ENCOUNTER — Telehealth: Payer: Self-pay | Admitting: Internal Medicine

## 2019-04-04 NOTE — Telephone Encounter (Signed)
Spoke with patient and she will take a picture of the letter and send it to her mychart.

## 2019-04-04 NOTE — Telephone Encounter (Signed)
CPE was cancelled.

## 2019-04-04 NOTE — Telephone Encounter (Signed)
Ok will send via my chart   Did not have the jury numbner or exact date .

## 2019-04-04 NOTE — Telephone Encounter (Signed)
Called Heather Lamb to r/s her physical appointment and she stated that she received a letter for jury on Thursday 23 and wanted to know if Dr. Fabian Sharp would write her a letter to excuse her due to her health and age.

## 2019-04-05 ENCOUNTER — Other Ambulatory Visit: Payer: Medicare Other

## 2019-04-05 ENCOUNTER — Ambulatory Visit: Payer: Medicare Other

## 2019-04-06 ENCOUNTER — Encounter: Payer: Medicare Other | Admitting: Internal Medicine

## 2019-04-27 NOTE — Telephone Encounter (Signed)
Heather Lamb can you fix the letter   To add the jurror number and get it to her?

## 2019-04-28 ENCOUNTER — Other Ambulatory Visit: Payer: Self-pay

## 2019-05-06 ENCOUNTER — Other Ambulatory Visit: Payer: Self-pay | Admitting: Internal Medicine

## 2019-05-09 HISTORY — PX: URETHRAL SLING: SHX2621

## 2019-06-02 ENCOUNTER — Ambulatory Visit: Payer: Medicare Other

## 2019-06-02 ENCOUNTER — Other Ambulatory Visit: Payer: Medicare Other

## 2019-06-08 DIAGNOSIS — Z79899 Other long term (current) drug therapy: Secondary | ICD-10-CM

## 2019-06-08 DIAGNOSIS — D582 Other hemoglobinopathies: Secondary | ICD-10-CM

## 2019-06-08 DIAGNOSIS — E785 Hyperlipidemia, unspecified: Secondary | ICD-10-CM

## 2019-06-09 NOTE — Telephone Encounter (Signed)
I have placed lab ordres please arrange a lab appt before her CPX in August  thanks

## 2019-07-12 NOTE — Progress Notes (Signed)
Chief Complaint  Patient presents with  . Annual Exam    no concerns     HPI: Heather Lamb 73 y.o. comes in today for Preventive Medicare examand chronic disease management    Better sleep.   Had stress incontincence .surgery sling   Per dr Zigmund Daniel.  Working on  Weight  Loss.  Feeling fine and feels lexapro has helped her through her husbands tobacco cessation the last 2 months   w exercising no cv pulm sx nor panic sx    Health Maintenance  Topic Date Due  . COLON CANCER SCREENING ANNUAL FOBT  03/24/2017  . INFLUENZA VACCINE  07/09/2019  . MAMMOGRAM  02/24/2020  . TETANUS/TDAP  09/24/2027  . DEXA SCAN  Completed  . Hepatitis C Screening  Completed  . PNA vac Low Risk Adult  Completed  . 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 Last eye check UTD  Safety:  Has smoke detector and wears seat belts.  No firearms. No excess sun exposure. Sees dentist regularly.  Falls:  no  Memory: Felt to be good  , no concern from her or her family.  Depression: No anhedonia unusual crying or depressive symptoms  Nutrition: Eats well balanced diet; adequate calcium and vitamin D. No swallowing chewing problems.  Injury: no major injuries in the last six months.  Other healthcare providers:  Reviewed today .  Preventive parameters:   Reviewed   ADLS:   There are no problems or need for assistance  driving, feeding, obtaining food, dressing, toileting and bathing, managing money using phone. She is independent.   ROS:  GEN/ HEENT: No fever, significant weight changes sweats headaches vision problems hearing changes, CV/ PULM; No chest pain shortness of breath cough, syncope,edema  change in exercise tolerance. GI /GU: No adominal pain,  vomiting, change in bowel habits. No blood in the stool. No significant GU symptoms. SKIN/HEME: ,no acute skin rashes suspicious lesions or bleeding. No lymphadenopathy, nodules, masses.  NEURO/ PSYCH:  No neurologic signs such as weakness numbness. No depression anxiety. IMM/ Allergy: No unusual infections.  Allergy .   REST of 12 system review negative except as per HPI   Past Medical History:  Diagnosis Date  . Allergy   . Anxiety   . Epiglottitis 11/13/2015  . History of UTI   . Kidney stones   . Panic attacks 08/23/2014  . Right lower lobe pneumonia ([place]) 10/11/2011  . Supraglottitis 11/13/2015    Family History  Problem Relation Age of Onset  . Arthritis Father   . Heart disease Father   . Hypertension Other        parent  . Stroke Other   . Hyperlipidemia Other     Social History   Socioeconomic History  . Marital status: Married    Spouse name: Not on file  . Number of children: 4  . Years of education: Not on file  . Highest education level: Not on file  Occupational History  . 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 Financial resource strain: Not hard at all  . Food insecurity    Worry: Never true    Inability: Never true  .  Transportation needs    Medical: No    Non-medical: No  Tobacco Use  . Smoking status: Never Smoker  . Smokeless tobacco: Never Used  Substance and Sexual Activity  . Alcohol use: No  . Drug use: No  . Sexual activity: Not on file  Lifestyle  . Physical activity    Days per week: 5 days    Minutes per session: 90 min  . Stress: Only a little  Relationships  . Social connections    Talks on phone: More than three times a week    Gets together: Twice a week    Attends religious service: Not on file    Active member of club or organization: Not on file    Attends meetings of clubs or organizations: Not on file    Relationship status: Married  Other Topics Concern  . Not on file  Social History  Narrative   HHof 2    Retired Investment banker, operational degree   g4 p4    Neg tad husband smokes but no ets.       01/26/19: Lives with husband in 2 story house   Has two daughters, two sons, all of whom live locally with grandkids. Active in their care.   Exercises at [facility] doing elliptical, bike, weight training        Outpatient Encounter Medications as of 07/13/2019  Medication Sig  . calcium carbonate (OS-CAL) 600 MG TABS tablet Take 600 mg by mouth 2 (two) times daily with a meal. With vit D3  . Cholecalciferol (VITAMIN D PO) Take 1 tablet by mouth daily.  Marland Kitchen escitalopram (LEXAPRO) 10 MG tablet TAKE 1 TABLET(10 MG) BY MOUTH DAILY  . valACYclovir (VALTREX) 1000 MG tablet Take 2 tablets (2,000 mg total) by mouth 2 (two) times daily. For cold sores.   No facility-administered encounter medications on file as of 07/13/2019.     EXAM:  BP 122/74 (BP Location: Right Arm, Patient Position: Sitting, Cuff Size: Large)   Pulse 98   Temp 98.3 F (36.8 C) (Temporal)   Ht 5\' 6"  (1.676 m)   Wt 194 lb 9.6 oz (88.3 kg)   SpO2 97%   BMI 31.41 kg/m   Body mass index is 31.41 kg/m. Wt Readings from Last 3 Encounters:  07/13/19 194 lb 9.6 oz (88.3 kg)  01/26/19 190 lb (86.2 kg)  11/16/18 198 lb (89.8 kg)    Physical Exam: Vital signs reviewed ONG:EXBM is a well-developed well-nourished alert cooperative   who appears stated age in no acute distress.  HEENT: normocephalic atraumatic , Eyes: PERRL EOM's full, conjunctiva clear, Nares: paten,t no deformity discharge or tenderness., Ears: no deformity EAC's clear TMs with normal landmarks. Mouth: deferred  Masked NECK: supple without masses, thyromegaly or bruits. CHEST/PULM:  Clear to auscultation and percussion breath sounds equal no wheeze , rales or rhonchi. No chest wall deformities or tenderness. CV: PMI is nondisplaced, S1 S2 no gallops, murmurs, rubs. Peripheral pulses are full without delay.No JVD .  ABDOMEN: Bowel sounds normal nontender   No guard or rebound, no hepato splenomegal no CVA tenderness.   Extremtities:  No clubbing cyanosis or edema, no acute joint swelling or redness no focal atrophy NEURO:  Oriented x3, cranial nerves 3-12 appear to be intact, no obvious focal weakness,gait within normal limits no abnormal reflexes or asymmetrical SKIN: No acute rashes normal turgor, color, no bruising or petechiae. PSYCH: Oriented, good eye contact, no obvious depression anxiety, cognition and judgment  appear normal. LN: no cervical axillary inguinal adenopathy No noted deficits in memory, attention, and speech.   Lab Results  Component Value Date   WBC 6.2 09/23/2017   HGB 15.0 09/23/2017   HCT 45.1 09/23/2017   PLT 205.0 09/23/2017   GLUCOSE 87 09/23/2017   CHOL 183 03/09/2018   TRIG 109.0 03/09/2018   HDL 42.80 03/09/2018   LDLCALC 118 (H) 03/09/2018   ALT 25 09/23/2017   AST 21 09/23/2017   NA 139 09/23/2017   K 3.9 09/23/2017   CL 102 09/23/2017   CREATININE 0.63 09/23/2017   BUN 18 09/23/2017   CO2 29 09/23/2017   TSH 1.02 09/23/2017   HGBA1C 5.9 04/30/2016    ASSESSMENT AND PLAN:  Discussed the following assessment and plan:   ICD-10-CM   1. Visit for preventive health examination  Z00.00   2. Medication management  Z79.899   3. Screening for colon cancer  Z12.11 Cologuard  4. Hyperlipidemia, unspecified hyperlipidemia type  E78.5    controlled life style    med Benefit more than risk of medications  to continue.  Can check up yearly or as needed  And keetp utd on hcm labs dexa due soon etc   Patient Care Team: , Neta MendsWanda K, MD as PCP - General (Internal Medicine) Nita SellsHall, John, MD (Dermatology) Serena Colonelosen, Jefry, MD as Consulting Physician (Otolaryngology) Daisy Lazarotter, Mark, DO [facility](Optometry)  Patient Instructions  Glad  you are doing so well.  Will get new cologuard order sent  For you.  CPX in a year or as needed.  Pharmacy cna  Send in electronic refill request when needed    Health Maintenance,  Female Adopting a healthy lifestyle and getting preventive care are important in promoting health and wellness. Ask your health care provider about:  The right schedule for you to have regular tests and exams.  Things you can do on your own to prevent diseases and keep yourself healthy. What should I know about diet, weight, and exercise? Eat a healthy diet   Eat a diet that includes plenty of vegetables, fruits, low-fat dairy products, and lean protein.  Do not eat a lot of foods that are high in solid fats, added sugars, or sodium. Maintain a healthy weight Body mass index (BMI) is used to identify weight problems. It estimates body fat based on height and weight. Your health care provider can help determine your BMI and help you achieve or maintain a healthy weight. Get regular exercise Get regular exercise. This is one of the most important things you can do for your health. Most adults should:  Exercise for at least 150 minutes each week. The exercise should increase your heart rate and make you sweat (moderate-intensity exercise).  Do strengthening exercises at least twice a week. This is in addition to the moderate-intensity exercise.  Spend less time sitting. Even light physical activity can be beneficial. Watch cholesterol and blood lipids Have your blood tested for lipids and cholesterol at 71 years of age, then have this test every 5 years. Have your cholesterol levels checked more often if:  Your lipid or cholesterol levels are high.  You are older than 71 years of age.  You are at high risk for heart disease. What should I know about cancer screening? Depending on your health history and family history, you may need to have cancer screening at various ages. This may include screening for:  Breast cancer.  Cervical cancer.  Colorectal cancer.  Skin cancer.  Lung  cancer. What should I know about heart disease, diabetes, and high blood pressure? Blood pressure  and heart disease  High blood pressure causes heart disease and increases the risk of stroke. This is more likely to develop in people who have high blood pressure readings, are of African descent, or are overweight.  Have your blood pressure checked: ? Every 3-5 years if you are 8018-71 years of age. ? Every year if you are 71 years old or older. Diabetes Have regular diabetes screenings. This checks your fasting blood sugar level. Have the screening done:  Once every three years after age 71 if you are at a normal weight and have a low risk for diabetes.  More often and at a younger age if you are overweight or have a high risk for diabetes. What should I know about preventing infection? Hepatitis B If you have a higher risk for hepatitis B, you should be screened for this virus. Talk with your health care provider to find out if you are at risk for hepatitis B infection. Hepatitis C Testing is recommended for:  Everyone born from [id number] through 1965.  Anyone with known risk factors for hepatitis C. Sexually transmitted infections (STIs)  Get screened for STIs, including gonorrhea and chlamydia, if: ? You are sexually active and are younger than 71 years of age. ? You are older than 71 years of age and your health care provider tells you that you are at risk for this type of infection. ? Your sexual activity has changed since you were last screened, and you are at increased risk for chlamydia or gonorrhea. Ask your health care provider if you are at risk.  Ask your health care provider about whether you are at high risk for HIV. Your health care provider may recommend a prescription medicine to help prevent HIV infection. If you choose to take medicine to prevent HIV, you should first get tested for HIV. You should then be tested every 3 months for as long as you are taking the medicine. Pregnancy  If you are about to stop having your period (premenopausal) and you may become pregnant,  seek counseling before you get pregnant.  Take 400 to 800 micrograms (mcg) of folic acid every day if you become pregnant.  Ask for birth control (contraception) if you want to prevent pregnancy. Osteoporosis and menopause Osteoporosis is a disease in which the bones lose minerals and strength with aging. This can result in bone fractures. If you are 71 years old or older, or if you are at risk for osteoporosis and fractures, ask your health care provider if you should:  Be screened for bone loss.  Take a calcium or vitamin D supplement to lower your risk of fractures.  Be given hormone replacement therapy (HRT) to treat symptoms of menopause. Follow these instructions at home: Lifestyle  Do not use any products that contain nicotine or tobacco, such as cigarettes, e-cigarettes, and chewing tobacco. If you need help quitting, ask your health care provider.  Do not use street drugs.  Do not share needles.  Ask your health care provider for help if you need support or information about quitting drugs. Alcohol use  Do not drink alcohol if: ? Your health care provider tells you not to drink. ? You are pregnant, may be pregnant, or are planning to become pregnant.  If you drink alcohol: ? Limit how much you use to 0-1 drink a day. ? Limit intake if you are breastfeeding.  Be  aware of how much alcohol is in your drink. In the [place] one drink equals one 12 oz bottle of beer (355 mL), one 5 oz glass of wine (148 mL), or one 1 oz glass of hard liquor (44 mL). General instructions  Schedule regular health, dental, and eye exams.  Stay current with your vaccines.  Tell your health care provider if: ? You often feel depressed. ? You have ever been abused or do not feel safe at home. Summary  Adopting a healthy lifestyle and getting preventive care are important in promoting health and wellness.  Follow your health care provider's instructions about healthy diet, exercising, and  getting tested or screened for diseases.  Follow your health care provider's instructions on monitoring your cholesterol and blood pressure. This information is not intended to replace advice given to you by your health care provider. Make sure you discuss any questions you have with your health care provider. Document Released: 06/09/2011 Document Revised: 11/17/2018 Document Reviewed: 11/17/2018 Elsevier Patient Education  2020 [employer]    NaknekWanda K.  M.D.

## 2019-07-13 ENCOUNTER — Encounter: Payer: Self-pay | Admitting: Internal Medicine

## 2019-07-13 ENCOUNTER — Ambulatory Visit (INDEPENDENT_AMBULATORY_CARE_PROVIDER_SITE_OTHER): Payer: Medicare Other | Admitting: Internal Medicine

## 2019-07-13 ENCOUNTER — Other Ambulatory Visit: Payer: Self-pay

## 2019-07-13 VITALS — BP 122/74 | HR 98 | Temp 98.3°F | Ht 66.0 in | Wt 194.6 lb

## 2019-07-13 DIAGNOSIS — Z1211 Encounter for screening for malignant neoplasm of colon: Secondary | ICD-10-CM | POA: Diagnosis not present

## 2019-07-13 DIAGNOSIS — Z79899 Other long term (current) drug therapy: Secondary | ICD-10-CM | POA: Diagnosis not present

## 2019-07-13 DIAGNOSIS — Z0000 Encounter for general adult medical examination without abnormal findings: Secondary | ICD-10-CM

## 2019-07-13 DIAGNOSIS — E785 Hyperlipidemia, unspecified: Secondary | ICD-10-CM

## 2019-07-13 NOTE — Patient Instructions (Addendum)
Glad  you are doing so well.  Will get new cologuard order sent  For you.  CPX in a year or as needed.  Pharmacy cna  Send in electronic refill request when needed    Health Maintenance, Female Adopting a healthy lifestyle and getting preventive care are important in promoting health and wellness. Ask your health care provider about:  The right schedule for you to have regular tests and exams.  Things you can do on your own to prevent diseases and keep yourself healthy. What should I know about diet, weight, and exercise? Eat a healthy diet   Eat a diet that includes plenty of vegetables, fruits, low-fat dairy products, and lean protein.  Do not eat a lot of foods that are high in solid fats, added sugars, or sodium. Maintain a healthy weight Body mass index (BMI) is used to identify weight problems. It estimates body fat based on height and weight. Your health care provider can help determine your BMI and help you achieve or maintain a healthy weight. Get regular exercise Get regular exercise. This is one of the most important things you can do for your health. Most adults should:  Exercise for at least 150 minutes each week. The exercise should increase your heart rate and make you sweat (moderate-intensity exercise).  Do strengthening exercises at least twice a week. This is in addition to the moderate-intensity exercise.  Spend less time sitting. Even light physical activity can be beneficial. Watch cholesterol and blood lipids Have your blood tested for lipids and cholesterol at 71 years of age, then have this test every 5 years. Have your cholesterol levels checked more often if:  Your lipid or cholesterol levels are high.  You are older than 71 years of age.  You are at high risk for heart disease. What should I know about cancer screening? Depending on your health history and family history, you may need to have cancer screening at various ages. This may include  screening for:  Breast cancer.  Cervical cancer.  Colorectal cancer.  Skin cancer.  Lung cancer. What should I know about heart disease, diabetes, and high blood pressure? Blood pressure and heart disease  High blood pressure causes heart disease and increases the risk of stroke. This is more likely to develop in people who have high blood pressure readings, are of African descent, or are overweight.  Have your blood pressure checked: ? Every 3-5 years if you are 3418-71 years of age. ? Every year if you are 71 years old or older. Diabetes Have regular diabetes screenings. This checks your fasting blood sugar level. Have the screening done:  Once every three years after age 71 if you are at a normal weight and have a low risk for diabetes.  More often and at a younger age if you are overweight or have a high risk for diabetes. What should I know about preventing infection? Hepatitis B If you have a higher risk for hepatitis B, you should be screened for this virus. Talk with your health care provider to find out if you are at risk for hepatitis B infection. Hepatitis C Testing is recommended for:  Everyone born from [birth year] through 1965.  Anyone with known risk factors for hepatitis C. Sexually transmitted infections (STIs)  Get screened for STIs, including gonorrhea and chlamydia, if: ? You are sexually active and are younger than 71 years of age. ? You are older than 71 years of age and your health care provider  tells you that you are at risk for this type of infection. ? Your sexual activity has changed since you were last screened, and you are at increased risk for chlamydia or gonorrhea. Ask your health care provider if you are at risk.  Ask your health care provider about whether you are at high risk for HIV. Your health care provider may recommend a prescription medicine to help prevent HIV infection. If you choose to take medicine to prevent HIV, you should first get  tested for HIV. You should then be tested every 3 months for as long as you are taking the medicine. Pregnancy  If you are about to stop having your period (premenopausal) and you may become pregnant, seek counseling before you get pregnant.  Take 400 to 800 micrograms (mcg) of folic acid every day if you become pregnant.  Ask for birth control (contraception) if you want to prevent pregnancy. Osteoporosis and menopause Osteoporosis is a disease in which the bones lose minerals and strength with aging. This can result in bone fractures. If you are 54 years old or older, or if you are at risk for osteoporosis and fractures, ask your health care provider if you should:  Be screened for bone loss.  Take a calcium or vitamin D supplement to lower your risk of fractures.  Be given hormone replacement therapy (HRT) to treat symptoms of menopause. Follow these instructions at home: Lifestyle  Do not use any products that contain nicotine or tobacco, such as cigarettes, e-cigarettes, and chewing tobacco. If you need help quitting, ask your health care provider.  Do not use street drugs.  Do not share needles.  Ask your health care provider for help if you need support or information about quitting drugs. Alcohol use  Do not drink alcohol if: ? Your health care provider tells you not to drink. ? You are pregnant, may be pregnant, or are planning to become pregnant.  If you drink alcohol: ? Limit how much you use to 0-1 drink a day. ? Limit intake if you are breastfeeding.  Be aware of how much alcohol is in your drink. In the [place] one drink equals one 12 oz bottle of beer (355 mL), one 5 oz glass of wine (148 mL), or one 1 oz glass of hard liquor (44 mL). General instructions  Schedule regular health, dental, and eye exams.  Stay current with your vaccines.  Tell your health care provider if: ? You often feel depressed. ? You have ever been abused or do not feel safe at home.  Summary  Adopting a healthy lifestyle and getting preventive care are important in promoting health and wellness.  Follow your health care provider's instructions about healthy diet, exercising, and getting tested or screened for diseases.  Follow your health care provider's instructions on monitoring your cholesterol and blood pressure. This information is not intended to replace advice given to you by your health care provider. Make sure you discuss any questions you have with your health care provider. Document Released: 06/09/2011 Document Revised: 11/17/2018 Document Reviewed: 11/17/2018 Elsevier Patient Education  2020 [employer].

## 2019-07-21 ENCOUNTER — Ambulatory Visit
Admission: RE | Admit: 2019-07-21 | Discharge: 2019-07-21 | Disposition: A | Discharge status: Home / Self Care | Payer: Medicare Other | Source: Ambulatory Visit | Attending: Internal Medicine | Admitting: Internal Medicine

## 2019-07-21 ENCOUNTER — Other Ambulatory Visit: Payer: Self-pay

## 2019-07-21 ENCOUNTER — Other Ambulatory Visit (INDEPENDENT_AMBULATORY_CARE_PROVIDER_SITE_OTHER): Payer: Medicare Other

## 2019-07-21 DIAGNOSIS — Z1231 Encounter for screening mammogram for malignant neoplasm of breast: Secondary | ICD-10-CM

## 2019-07-21 DIAGNOSIS — E785 Hyperlipidemia, unspecified: Secondary | ICD-10-CM

## 2019-07-21 DIAGNOSIS — Z79899 Other long term (current) drug therapy: Secondary | ICD-10-CM

## 2019-07-21 DIAGNOSIS — D582 Other hemoglobinopathies: Secondary | ICD-10-CM

## 2019-07-21 DIAGNOSIS — Z13820 Encounter for screening for osteoporosis: Secondary | ICD-10-CM

## 2019-07-21 LAB — CBC WITH DIFFERENTIAL/PLATELET
Basophils Absolute: 0 10*3/uL (ref 0.0–0.1)
Basophils Relative: 0.8 % (ref 0.0–3.0)
Eosinophils Absolute: 0.1 10*3/uL (ref 0.0–0.7)
Eosinophils Relative: 1.8 % (ref 0.0–5.0)
HCT: 43.3 % (ref 36.0–46.0)
Hemoglobin: 14.4 g/dL (ref 12.0–15.0)
Lymphocytes Relative: 43.5 % (ref 12.0–46.0)
Lymphs Abs: 2.5 10*3/uL (ref 0.7–4.0)
MCHC: 33.2 g/dL (ref 30.0–36.0)
MCV: 82.8 fl (ref 78.0–100.0)
Monocytes Absolute: 0.5 10*3/uL (ref 0.1–1.0)
Monocytes Relative: 8.9 % (ref 3.0–12.0)
Neutro Abs: 2.6 10*3/uL (ref 1.4–7.7)
Neutrophils Relative %: 45 % (ref 43.0–77.0)
Platelets: 244 10*3/uL (ref 150.0–400.0)
RBC: 5.23 Mil/uL — ABNORMAL HIGH (ref 3.87–5.11)
RDW: 13.9 % (ref 11.5–15.5)
WBC: 5.7 10*3/uL (ref 4.0–10.5)

## 2019-07-21 LAB — LIPID PANEL
Cholesterol: 207 mg/dL — ABNORMAL HIGH (ref 0–200)
HDL: 43 mg/dL (ref >39.00)
LDL Cholesterol: 132 mg/dL — ABNORMAL HIGH (ref 0–99)
NonHDL: 163.9
Total CHOL/HDL Ratio: 5
Triglycerides: 158 mg/dL — ABNORMAL HIGH (ref 0.0–149.0)
VLDL: 31.6 mg/dL (ref 0.0–40.0)

## 2019-07-21 LAB — HEPATIC FUNCTION PANEL
ALT: 24 U/L (ref 0–35)
AST: 20 U/L (ref 0–37)
Albumin: 4.5 g/dL (ref 3.5–5.2)
Alkaline Phosphatase: 88 U/L (ref 39–117)
Bilirubin, Direct: 0.1 mg/dL (ref 0.0–0.3)
Total Bilirubin: 0.8 mg/dL (ref 0.2–1.2)
Total Protein: 6.7 g/dL (ref 6.0–8.3)

## 2019-07-21 LAB — BASIC METABOLIC PANEL
BUN: 18 mg/dL (ref 6–23)
CO2: 31 mEq/L (ref 19–32)
Calcium: 9.4 mg/dL (ref 8.4–10.5)
Chloride: 100 mEq/L (ref 96–112)
Creatinine, Ser: 0.62 mg/dL (ref 0.40–1.20)
GFR: 94.98 mL/min (ref >60.00)
Glucose, Bld: 110 mg/dL — ABNORMAL HIGH (ref 70–99)
Potassium: 4.8 mEq/L (ref 3.5–5.1)
Sodium: 140 mEq/L (ref 135–145)

## 2019-07-21 LAB — TSH: TSH: 1.69 u[IU]/mL (ref 0.35–4.50)

## 2019-08-05 NOTE — Telephone Encounter (Signed)
Spoke to pt and advised per Dr.Panosh, to watch her medication to makes sure this isn't causing the issue and also to try OTC Meclizine. Pt also advised if she wanted to go to our [facility] pt declined and stated she will try the meclizine OTC.

## 2019-08-05 NOTE — Telephone Encounter (Signed)
Please advise 

## 2019-10-08 ENCOUNTER — Other Ambulatory Visit: Payer: Self-pay | Admitting: Internal Medicine

## 2020-01-23 ENCOUNTER — Telehealth: Payer: Self-pay | Admitting: Internal Medicine

## 2020-01-23 NOTE — Telephone Encounter (Signed)
Called patient to schedule AWV, but no answer. Will try to call patient at a later time. SF °

## 2020-02-08 DIAGNOSIS — E559 Vitamin D deficiency, unspecified: Secondary | ICD-10-CM | POA: Diagnosis not present

## 2020-02-08 DIAGNOSIS — E7800 Pure hypercholesterolemia, unspecified: Secondary | ICD-10-CM | POA: Diagnosis not present

## 2020-02-08 DIAGNOSIS — N951 Menopausal and female climacteric states: Secondary | ICD-10-CM | POA: Diagnosis not present

## 2020-02-08 DIAGNOSIS — R635 Abnormal weight gain: Secondary | ICD-10-CM | POA: Diagnosis not present

## 2020-02-13 DIAGNOSIS — F419 Anxiety disorder, unspecified: Secondary | ICD-10-CM | POA: Diagnosis not present

## 2020-02-13 DIAGNOSIS — E559 Vitamin D deficiency, unspecified: Secondary | ICD-10-CM | POA: Diagnosis not present

## 2020-02-13 DIAGNOSIS — Z1339 Encounter for screening examination for other mental health and behavioral disorders: Secondary | ICD-10-CM | POA: Diagnosis not present

## 2020-02-13 DIAGNOSIS — E7800 Pure hypercholesterolemia, unspecified: Secondary | ICD-10-CM | POA: Diagnosis not present

## 2020-02-13 DIAGNOSIS — Z1331 Encounter for screening for depression: Secondary | ICD-10-CM | POA: Diagnosis not present

## 2020-02-13 DIAGNOSIS — N951 Menopausal and female climacteric states: Secondary | ICD-10-CM | POA: Diagnosis not present

## 2020-02-13 DIAGNOSIS — Z6830 Body mass index (BMI) 30.0-30.9, adult: Secondary | ICD-10-CM | POA: Diagnosis not present

## 2020-02-22 DIAGNOSIS — Z6831 Body mass index (BMI) 31.0-31.9, adult: Secondary | ICD-10-CM | POA: Diagnosis not present

## 2020-02-22 DIAGNOSIS — R7303 Prediabetes: Secondary | ICD-10-CM | POA: Diagnosis not present

## 2020-02-29 DIAGNOSIS — E782 Mixed hyperlipidemia: Secondary | ICD-10-CM | POA: Diagnosis not present

## 2020-02-29 DIAGNOSIS — Z6830 Body mass index (BMI) 30.0-30.9, adult: Secondary | ICD-10-CM | POA: Diagnosis not present

## 2020-03-07 DIAGNOSIS — R7303 Prediabetes: Secondary | ICD-10-CM | POA: Diagnosis not present

## 2020-03-07 DIAGNOSIS — Z6830 Body mass index (BMI) 30.0-30.9, adult: Secondary | ICD-10-CM | POA: Diagnosis not present

## 2020-04-04 ENCOUNTER — Other Ambulatory Visit: Payer: Self-pay

## 2020-04-04 ENCOUNTER — Telehealth: Payer: Self-pay

## 2020-04-04 ENCOUNTER — Ambulatory Visit: Payer: Medicare PPO

## 2020-04-04 ENCOUNTER — Encounter: Payer: Self-pay | Admitting: Internal Medicine

## 2020-04-04 ENCOUNTER — Ambulatory Visit (INDEPENDENT_AMBULATORY_CARE_PROVIDER_SITE_OTHER): Payer: Medicare PPO | Admitting: Internal Medicine

## 2020-04-04 VITALS — BP 126/82 | HR 90 | Temp 98.2°F | Ht 66.0 in | Wt 198.4 lb

## 2020-04-04 DIAGNOSIS — Z01118 Encounter for examination of ears and hearing with other abnormal findings: Secondary | ICD-10-CM | POA: Diagnosis not present

## 2020-04-04 DIAGNOSIS — Z0000 Encounter for general adult medical examination without abnormal findings: Secondary | ICD-10-CM

## 2020-04-04 DIAGNOSIS — E785 Hyperlipidemia, unspecified: Secondary | ICD-10-CM | POA: Diagnosis not present

## 2020-04-04 DIAGNOSIS — Z79899 Other long term (current) drug therapy: Secondary | ICD-10-CM

## 2020-04-04 DIAGNOSIS — Z1211 Encounter for screening for malignant neoplasm of colon: Secondary | ICD-10-CM

## 2020-04-04 NOTE — Progress Notes (Signed)
Chief Complaint  Patient presents with  . Medicare Wellness    Doing well  . Annual Exam    HPI: Heather Lamb 74 y.o. comes in today for Preventive Medicare exam and wellness visit and cpx  .Since last visit.   generally doing well   To have    2 grand children.   June and September.  19 and excited  Is retired   To get covid vaccine soon .  Weight has gone up some with covid  Shut downb ut remains acive  Thinks lexaproi helping still   Not sure still needed but not sure wants to stop yet    See eye doc one contact '  Sees dermatology and ok  Dr halls office   utd mammor   Due for cologuard no sx  Retired Engineer, site  Taking up learning spanish  Hx cold sores  Had rash near brast better   Uses valtrex    Health Maintenance  Topic Date Due  . COVID-19 Vaccine (1) Never done  . COLON CANCER SCREENING ANNUAL FOBT  03/24/2017  . INFLUENZA VACCINE  07/08/2020  . MAMMOGRAM  07/20/2021  . TETANUS/TDAP  09/24/2027  . DEXA SCAN  Completed  . Hepatitis C Screening  Completed  . PNA vac Low Risk Adult  Completed  . COLONOSCOPY  Discontinued   Health Maintenance Review LIFESTYLE:  Exercise:   Walking   Tobacco/ETS: no  Alcohol:not  Sugar beverages:   no Sleep:   Plenty  Drug use: no HH: 2     Hearing:  Husband says down some   Gets congested   Vision:  No limitations at present . Last eye check UTD one contact  Right   Safety:  Has smoke detector and wears seat belts.  . No excess sun exposure. Sees dentist regularly.  Falls: n  Advance directive :  Reviewed  Has one.  hc poa husband and son    Memory: Felt to be good  , no concern from her or her family.  Depression: No anhedonia unusual crying or depressive symptoms  Nutrition: Eats well balanced diet; adequate calcium and vitamin D. No swallowing chewing problems.  Injury: no major injuries in the last six months.  Other healthcare providers:  Reviewed today .  Preventive parameters:  up-to-date  X covid vacc and cologuard   ADLS:   There are no problems or need for assistance  driving, feeding, obtaining food, dressing, toileting and bathing, managing money using phone. She is independent.    ROS:  GEN/ HEENT: No fever, significant weight changes sweats headaches vision problems    CV/ PULM; No chest pain shortness of breath cough, syncope,edema  change in exercise tolerance. GI /GU: No adominal pain, vomiting, change in bowel habits. No blood in the stool. No significant GU symptoms. SKIN/HEME: ,no acute skin rashes suspicious lesions or bleeding. No lymphadenopathy, nodules, masses.  NEURO/ PSYCH:  No neurologic signs such as weakness numbness. No depression anxiety. IMM/ Allergy: No unusual infections.  Allergy .   REST of 12 system review negative except as per HPI   Past Medical History:  Diagnosis Date  . Allergy   . Anxiety   . Epiglottitis 11/13/2015  . History of UTI   . Kidney stones   . Panic attacks 08/23/2014  . Right lower lobe pneumonia 10/11/2011  . Supraglottitis 11/13/2015    Family History  Problem Relation Age of Onset  . Arthritis Father   . Heart disease  Father   . Hypertension Other        parent  . Stroke Other   . Hyperlipidemia Other     Social History   Socioeconomic History  . Marital status: Married    Spouse name: Not on file  . Number of children: 4  . Years of education: Not on file  . Highest education level: Not on file  Occupational History  . Occupation: Editor, commissioning    Comment: retired  Tobacco Use  . Smoking status: Never Smoker  . Smokeless tobacco: Never Used  Substance and Sexual Activity  . Alcohol use: No  . Drug use: No  . Sexual activity: Not on file  Other Topics Concern  . Not on file  Social History Narrative   HHof 2    Retired Investment banker, operational degree   g4 p4    Neg tad husband smokes but no ets.  Now  stopped       01/26/19: Lives with husband in 2 story house   Has two daughters,  two sons, all of whom live locally with grandkids. Active in their care.   Exercises at [facility] doing elliptical, bike, weight training    Before covid active after but not yet as much       Social Determinants of Health   Financial Resource Strain:   . Difficulty of Paying Living Expenses:   Food Insecurity:   . Worried About Programme researcher, broadcasting/film/video in the Last Year:   . Barista in the Last Year:   Transportation Needs:   . Freight forwarder (Medical):   Marland Kitchen Lack of Transportation (Non-Medical):   Physical Activity:   . Days of Exercise per Week:   . Minutes of Exercise per Session:   Stress:   . Feeling of Stress :   Social Connections:   . Frequency of Communication with Friends and Family:   . Frequency of Social Gatherings with Friends and Family:   . Attends Religious Services:   . Active Member of Clubs or Organizations:   . Attends Banker Meetings:   Marland Kitchen Marital Status:     Outpatient Encounter Medications as of 04/04/2020  Medication Sig  . calcium carbonate (OS-CAL) 600 MG TABS tablet Take 600 mg by mouth 2 (two) times daily with a meal. With vit D3  . Cholecalciferol (VITAMIN D PO) Take 1 tablet by mouth daily.  Marland Kitchen escitalopram (LEXAPRO) 10 MG tablet TAKE 1 TABLET(10 MG) BY MOUTH DAILY  . valACYclovir (VALTREX) 1000 MG tablet TAKE 2 TABLETS(2000 MG) BY MOUTH TWICE DAILY FOR COLD SORES   No facility-administered encounter medications on file as of 04/04/2020.   Cognition   EXAM:  BP 126/82   Pulse 90   Temp 98.2 F (36.8 C) (Temporal)   Ht 5\' 6"  (1.676 m)   Wt 198 lb 6.4 oz (90 kg)   SpO2 98%   BMI 32.02 kg/m   Body mass index is 32.02 kg/m.  Physical Exam: Vital signs reviewed is a well-developed well-nourished alert cooperative   who appears stated age in no acute distress.  HEENT: normocephalic atraumatic , Eyes: PERRL EOM's full, conjunctiva clear, , Ears: no deformity EAC's clear TMs with normal landmarks. Mouth masked  NECK: supple without masses, thyromegaly or bruits. CHEST/PULM:  Clear to auscultation and percussion breath sounds equal no wheeze , rales or rhonchi. No chest wall deformities or tenderness. Breast: normal by inspection . No dimpling,  discharge, masses, tenderness or discharge . CV: PMI is nondisplaced, S1 S2 no gallops, murmurs, rubs. Peripheral pulses are full without delay.No JVD .  ABDOMEN: Bowel sounds normal nontender  No guard or rebound, no hepato splenomegal no CVA tenderness.   Extremtities:  No clubbing cyanosis or edema, no acute joint swelling or redness no focal atrophy NEURO:  Oriented x3, cranial nerves 3-12 appear to be intact, no obvious focal weakness,gait within normal limits no abnormal reflexes or asymmetrical SKIN: No acute rashes normal turgor, color, no bruising or petechiae. Sun changes no specific lesion of concern  PSYCH: Oriented, good eye contact, no obvious depression anxiety, cognition and judgment appear normal. LN: no cervical axillary inguinal adenopathy No noted deficits in memory, attention, and speech.   Hearing Screening   125Hz  250Hz  500Hz  1000Hz  2000Hz  3000Hz  4000Hz  6000Hz  8000Hz   Right ear:   Fail Fail Pass  Fail    Left ear:   Fail Pass Pass  Pass      Visual Acuity Screening   Right eye Left eye Both eyes  Without correction:  20/20 20/20  With correction:       Lab Results  Component Value Date   WBC 5.7 07/21/2019   HGB 14.4 07/21/2019   HCT 43.3 07/21/2019   PLT 244.0 07/21/2019   GLUCOSE 110 (H) 07/21/2019   CHOL 207 (H) 07/21/2019   TRIG 158.0 (H) 07/21/2019   HDL 43.00 07/21/2019   LDLCALC 132 (H) 07/21/2019   ALT 24 07/21/2019   AST 20 07/21/2019   NA 140 07/21/2019   K 4.8 07/21/2019   CL 100 07/21/2019   CREATININE 0.62 07/21/2019   BUN 18 07/21/2019   CO2 31 07/21/2019   TSH 1.69 07/21/2019   HGBA1C 5.9 04/30/2016    ASSESSMENT AND PLAN:  Discussed the following assessment and plan:  Visit for preventive health  examination  Encounter for Medicare annual wellness exam  Medication management - Plan: Basic metabolic panel, CBC with Differential/Platelet, Hemoglobin A1c, Hepatic function panel, Lipid panel  Hyperlipidemia, unspecified hyperlipidemia type - Plan: Basic metabolic panel, CBC with Differential/Platelet, Hemoglobin A1c, Hepatic function panel, Lipid panel  Screening for colon cancer  Hearing screen with abnormal findings Personal health advice  Given  Gt weight back down getting covid vaccine  Weight bearing exercise  Screening  Get cologuard  Will be coming back for fasting lab  Ok to try 5 mg lexapro  Or remain  She will let us know if needs other advice Advance care planning   No concerns at this time has hcpoa etc  Suggest formal hearing eval  Patient Care Team: Burnis Medin, MD as PCP - General (Internal Medicine) Allyn Kenner, MD (Dermatology) Izora Gala, MD as Consulting Physician (Otolaryngology) Madelin Headings, DO [facility])  Patient Instructions    Get your covid vaccine Get audiology check  Plan cologuard   To be done. Fasting lab appt     Heather Lamb ,  These are the goals we discussed:  Goals      General   . Patient Stated     Lose more weight to get to goal of 175lbs by next year!       This is a list of the screening recommended for you and due dates:  Health Maintenance  Topic Date Due  . COVID-19 Vaccine (1) Never done  . Stool Blood Test  03/24/2017  . Flu Shot  07/08/2020  . Mammogram  07/20/2021  . Tetanus Vaccine  09/24/2027  .  DEXA scan (bone density measurement)  Completed  .  Hepatitis C: One time screening is recommended by Center for Disease Control  (CDC) for  adults born from [birth year] through 1963.   Completed  . Pneumonia vaccines  Completed  . Colon Cancer Screening  Discontinued    Neta Mends. Erasto Sleight M.D.

## 2020-04-04 NOTE — Patient Instructions (Addendum)
  Get your covid vaccine Get audiology check  Plan cologuard   To be done. Fasting lab appt     Ms. Carneiro ,  These are the goals we discussed:  Goals      General   . Patient Stated     Lose more weight to get to goal of 175lbs by next year!       This is a list of the screening recommended for you and due dates:  Health Maintenance  Topic Date Due  . COVID-19 Vaccine (1) Never done  . Stool Blood Test  03/24/2017  . Flu Shot  07/08/2020  . Mammogram  07/20/2021  . Tetanus Vaccine  09/24/2027  . DEXA scan (bone density measurement)  Completed  .  Hepatitis C: One time screening is recommended by Center for Disease Control  (CDC) for  adults born from [birth year] through 1963.   Completed  . Pneumonia vaccines  Completed  . Colon Cancer Screening  Discontinued

## 2020-04-04 NOTE — Telephone Encounter (Signed)
Faxed Cologuard request form to [phone] with demographic and insurance information for patient.

## 2020-04-07 ENCOUNTER — Other Ambulatory Visit: Payer: Self-pay | Admitting: Internal Medicine

## 2020-05-15 MED ORDER — LORAZEPAM 0.5 MG PO TABS: 0.5000 mg | ORAL_TABLET | Freq: Two times a day (BID) | ORAL | 0 refills | Status: DC | PRN

## 2020-05-15 NOTE — Telephone Encounter (Signed)
I sent in lorazepam  Previously  rxed . [facility] [address]  Not sure which [facility]  was correct

## 2020-07-12 ENCOUNTER — Other Ambulatory Visit: Payer: Medicare PPO

## 2020-08-16 DIAGNOSIS — M25571 Pain in right ankle and joints of right foot: Secondary | ICD-10-CM | POA: Diagnosis not present

## 2020-08-16 DIAGNOSIS — S8264XA Nondisplaced fracture of lateral malleolus of right fibula, initial encounter for closed fracture: Secondary | ICD-10-CM | POA: Diagnosis not present

## 2020-08-20 ENCOUNTER — Other Ambulatory Visit: Payer: Self-pay | Admitting: Sports Medicine

## 2020-08-20 ENCOUNTER — Other Ambulatory Visit: Payer: Self-pay | Admitting: Internal Medicine

## 2020-08-20 DIAGNOSIS — M25571 Pain in right ankle and joints of right foot: Secondary | ICD-10-CM

## 2020-08-20 DIAGNOSIS — Z1231 Encounter for screening mammogram for malignant neoplasm of breast: Secondary | ICD-10-CM

## 2020-08-21 ENCOUNTER — Ambulatory Visit
Admission: RE | Admit: 2020-08-21 | Discharge: 2020-08-21 | Disposition: A | Discharge status: Home / Self Care | Payer: Medicare PPO | Source: Ambulatory Visit | Attending: Sports Medicine | Admitting: Sports Medicine

## 2020-08-21 DIAGNOSIS — M25471 Effusion, right ankle: Secondary | ICD-10-CM | POA: Diagnosis not present

## 2020-08-21 DIAGNOSIS — M25571 Pain in right ankle and joints of right foot: Secondary | ICD-10-CM

## 2020-08-21 DIAGNOSIS — M19071 Primary osteoarthritis, right ankle and foot: Secondary | ICD-10-CM | POA: Diagnosis not present

## 2020-08-23 DIAGNOSIS — M25571 Pain in right ankle and joints of right foot: Secondary | ICD-10-CM | POA: Diagnosis not present

## 2020-08-23 DIAGNOSIS — S8261XK Displaced fracture of lateral malleolus of right fibula, subsequent encounter for closed fracture with nonunion: Secondary | ICD-10-CM | POA: Diagnosis not present

## 2020-09-05 DIAGNOSIS — S46911A Strain of unspecified muscle, fascia and tendon at shoulder and upper arm level, right arm, initial encounter: Secondary | ICD-10-CM | POA: Diagnosis not present

## 2020-09-10 DIAGNOSIS — S46911A Strain of unspecified muscle, fascia and tendon at shoulder and upper arm level, right arm, initial encounter: Secondary | ICD-10-CM | POA: Diagnosis not present

## 2020-09-12 ENCOUNTER — Other Ambulatory Visit: Payer: Self-pay

## 2020-09-12 ENCOUNTER — Ambulatory Visit
Admission: RE | Admit: 2020-09-12 | Discharge: 2020-09-12 | Disposition: A | Discharge status: Home / Self Care | Payer: Medicare PPO | Source: Ambulatory Visit | Attending: Internal Medicine | Admitting: Internal Medicine

## 2020-09-12 DIAGNOSIS — Z1231 Encounter for screening mammogram for malignant neoplasm of breast: Secondary | ICD-10-CM | POA: Diagnosis not present

## 2020-09-19 DIAGNOSIS — S46911A Strain of unspecified muscle, fascia and tendon at shoulder and upper arm level, right arm, initial encounter: Secondary | ICD-10-CM | POA: Diagnosis not present

## 2020-10-01 ENCOUNTER — Other Ambulatory Visit: Payer: Self-pay | Admitting: Internal Medicine

## 2020-11-04 ENCOUNTER — Other Ambulatory Visit: Payer: Self-pay | Admitting: Internal Medicine

## 2020-11-30 ENCOUNTER — Other Ambulatory Visit: Payer: Self-pay | Admitting: Internal Medicine

## 2021-03-06 ENCOUNTER — Ambulatory Visit: Payer: Medicare PPO | Admitting: Internal Medicine

## 2021-04-08 ENCOUNTER — Ambulatory Visit (INDEPENDENT_AMBULATORY_CARE_PROVIDER_SITE_OTHER): Payer: Medicare PPO

## 2021-04-08 DIAGNOSIS — Z0000 Encounter for general adult medical examination without abnormal findings: Secondary | ICD-10-CM

## 2021-04-08 DIAGNOSIS — Z1231 Encounter for screening mammogram for malignant neoplasm of breast: Secondary | ICD-10-CM

## 2021-04-08 DIAGNOSIS — Z1211 Encounter for screening for malignant neoplasm of colon: Secondary | ICD-10-CM | POA: Diagnosis not present

## 2021-04-08 NOTE — Patient Instructions (Addendum)
[name] , Thank you for taking time to come for your Medicare Wellness Visit. I appreciate your ongoing commitment to your health goals. Please review the following plan we discussed and let me know if I can assist you in the future.   Screening recommendations/referrals: Colonoscopy: referral completed 04/08/2021 Mammogram: referral completed 04/08/2021 Bone Density: Completed 07/21/2019 Recommended yearly ophthalmology/optometry visit for glaucoma screening and checkup Recommended yearly dental visit for hygiene and checkup  Vaccinations: Influenza vaccine: completed fall 2022  Pneumococcal vaccine:  Completed series  Tdap vaccine: current due 09/24/2027 Shingles vaccine: completed series     Advanced directives: copies in chart  Conditions/risks identified: none   Next appointment: June 15 th 200pm with Dr. Fabian Sharp    Preventive Care 44 Years and Older, Female Preventive care refers to lifestyle choices and visits with your health care provider that can promote health and wellness. What does preventive care include?  A yearly physical exam. This is also called an annual well check.  Dental exams once or twice a year.  Routine eye exams. Ask your health care provider how often you should have your eyes checked.  Personal lifestyle choices, including:  Daily care of your teeth and gums.  Regular physical activity.  Eating a healthy diet.  Avoiding tobacco and drug use.  Limiting alcohol use.  Practicing safe sex.  Taking low-dose aspirin every day.  Taking vitamin and mineral supplements as recommended by your health care provider. What happens during an annual well check? The services and screenings done by your health care provider during your annual well check will depend on your age, overall health, lifestyle risk factors, and family history of disease. Counseling  Your health care provider may ask you questions about your:  Alcohol use.  Tobacco  use.  Drug use.  Emotional well-being.  Home and relationship well-being.  Sexual activity.  Eating habits.  History of falls.  Memory and ability to understand (cognition).  Work and work Astronomer.  Reproductive health. Screening  You may have the following tests or measurements:  Height, weight, and BMI.  Blood pressure.  Lipid and cholesterol levels. These may be checked every 5 years, or more frequently if you are over 89 years old.  Skin check.  Lung cancer screening. You may have this screening every year starting at age 42 if you have a 30-pack-year history of smoking and currently smoke or have quit within the past 15 years.  Fecal occult blood test (FOBT) of the stool. You may have this test every year starting at age 64.  Flexible sigmoidoscopy or colonoscopy. You may have a sigmoidoscopy every 5 years or a colonoscopy every 10 years starting at age 32.  Hepatitis C blood test.  Hepatitis B blood test.  Sexually transmitted disease (STD) testing.  Diabetes screening. This is done by checking your blood sugar (glucose) after you have not eaten for a while (fasting). You may have this done every 1-3 years.  Bone density scan. This is done to screen for osteoporosis. You may have this done starting at age 5.  Mammogram. This may be done every 1-2 years. Talk to your health care provider about how often you should have regular mammograms. Talk with your health care provider about your test results, treatment options, and if necessary, the need for more tests. Vaccines  Your health care provider may recommend certain vaccines, such as:  Influenza vaccine. This is recommended every year.  Tetanus, diphtheria, and acellular pertussis (Tdap, Td) vaccine. You  may need a Td booster every 10 years.  Zoster vaccine. You may need this after age 23.  Pneumococcal 13-valent conjugate (PCV13) vaccine. One dose is recommended after age 76.  Pneumococcal  polysaccharide (PPSV23) vaccine. One dose is recommended after age 19. Talk to your health care provider about which screenings and vaccines you need and how often you need them. This information is not intended to replace advice given to you by your health care provider. Make sure you discuss any questions you have with your health care provider. Document Released: 12/21/2015 Document Revised: 08/13/2016 Document Reviewed: 09/25/2015 Elsevier Interactive Patient Education  2017 [place] Prevention in the Home Falls can cause injuries. They can happen to people of all ages. There are many things you can do to make your home safe and to help prevent falls. What can I do on the outside of my home?  Regularly fix the edges of walkways and driveways and fix any cracks.  Remove anything that might make you trip as you walk through a door, such as a raised step or threshold.  Trim any bushes or trees on the path to your home.  Use bright outdoor lighting.  Clear any walking paths of anything that might make someone trip, such as rocks or tools.  Regularly check to see if handrails are loose or broken. Make sure that both sides of any steps have handrails.  Any raised decks and porches should have guardrails on the edges.  Have any leaves, snow, or ice cleared regularly.  Use sand or salt on walking paths during winter.  Clean up any spills in your garage right away. This includes oil or grease spills. What can I do in the bathroom?  Use night lights.  Install grab bars by the toilet and in the tub and shower. Do not use towel bars as grab bars.  Use non-skid mats or decals in the tub or shower.  If you need to sit down in the shower, use a plastic, non-slip stool.  Keep the floor dry. Clean up any water that spills on the floor as soon as it happens.  Remove soap buildup in the tub or shower regularly.  Attach bath mats securely with double-sided non-slip rug  tape.  Do not have throw rugs and other things on the floor that can make you trip. What can I do in the bedroom?  Use night lights.  Make sure that you have a light by your bed that is easy to reach.  Do not use any sheets or blankets that are too big for your bed. They should not hang down onto the floor.  Have a firm chair that has side arms. You can use this for support while you get dressed.  Do not have throw rugs and other things on the floor that can make you trip. What can I do in the kitchen?  Clean up any spills right away.  Avoid walking on wet floors.  Keep items that you use a lot in easy-to-reach places.  If you need to reach something above you, use a strong step stool that has a grab bar.  Keep electrical cords out of the way.  Do not use floor polish or wax that makes floors slippery. If you must use wax, use non-skid floor wax.  Do not have throw rugs and other things on the floor that can make you trip. What can I do with my stairs?  Do not leave any items  on the stairs.  Make sure that there are handrails on both sides of the stairs and use them. Fix handrails that are broken or loose. Make sure that handrails are as long as the stairways.  Check any carpeting to make sure that it is firmly attached to the stairs. Fix any carpet that is loose or worn.  Avoid having throw rugs at the top or bottom of the stairs. If you do have throw rugs, attach them to the floor with carpet tape.  Make sure that you have a light switch at the top of the stairs and the bottom of the stairs. If you do not have them, ask someone to add them for you. What else can I do to help prevent falls?  Wear shoes that:  Do not have high heels.  Have rubber bottoms.  Are comfortable and fit you well.  Are closed at the toe. Do not wear sandals.  If you use a stepladder:  Make sure that it is fully opened. Do not climb a closed stepladder.  Make sure that both sides of the  stepladder are locked into place.  Ask someone to hold it for you, if possible.  Clearly mark and make sure that you can see:  Any grab bars or handrails.  First and last steps.  Where the edge of each step is.  Use tools that help you move around (mobility aids) if they are needed. These include:  Canes.  Walkers.  Scooters.  Crutches.  Turn on the lights when you go into a dark area. Replace any light bulbs as soon as they burn out.  Set up your furniture so you have a clear path. Avoid moving your furniture around.  If any of your floors are uneven, fix them.  If there are any pets around you, be aware of where they are.  Review your medicines with your doctor. Some medicines can make you feel dizzy. This can increase your chance of falling. Ask your doctor what other things that you can do to help prevent falls. This information is not intended to replace advice given to you by your health care provider. Make sure you discuss any questions you have with your health care provider. Document Released: 09/20/2009 Document Revised: 05/01/2016 Document Reviewed: 12/29/2014 Elsevier Interactive Patient Education  2017 [employer].

## 2021-04-08 NOTE — Progress Notes (Signed)
Subjective:   JOCLYNN LUMB is a 75 y.o. female who presents for Medicare Annual (Subsequent) preventive examination.    Virtual Visit via Video Note  I connected with Ottis Stain  by a video enabled telemedicine application and verified that I am speaking with the correct person using two identifiers.  Location: Patient: Home Provider: Office Persons participating in the virtual visit: patient, provider   I discussed the limitations of evaluation and management by telemedicine and the availability of in person appointments. The patient expressed understanding and agreed to proceed.     Vernona Rieger Jumana Paccione,LPN   Review of Systems    n/a Cardiac Risk Factors include: advanced age (>45men, >60 women);dyslipidemia     Objective:    Today's Vitals   There is no height or weight on file to calculate BMI.  Advanced Directives 04/08/2021 01/26/2019 11/13/2015 11/13/2015  Does Patient Have a Medical Advance Directive? No;Yes Yes Yes No  Type of Estate agent of [place];Living will Healthcare Power of [place];Living will Healthcare Power of [place];Living will -  Does patient want to make changes to medical advance directive? - No - Patient declined No - Patient declined -  Copy of Healthcare Power of Attorney in Chart? Yes - validated most recent copy scanned in chart (See row information) Yes - validated most recent copy scanned in chart (See row information) Yes -  Would patient like information on creating a medical advance directive? No - Patient declined - - -    Current Medications (verified) Outpatient Encounter Medications as of 04/08/2021  Medication Sig  . calcium carbonate (OS-CAL) 600 MG TABS tablet Take 600 mg by mouth 2 (two) times daily with a meal. With vit D3  . Cholecalciferol (VITAMIN D PO) Take 1 tablet by mouth daily.  Marland Kitchen LORazepam (ATIVAN) 0.5 MG tablet Take 1 tablet (0.5 mg total) by mouth 2 (two) times daily as needed for anxiety.  .  valACYclovir (VALTREX) 1000 MG tablet TAKE 2 TABLETS(2000 MG) BY MOUTH TWICE DAILY FOR COLD SORES  . escitalopram (LEXAPRO) 10 MG tablet TAKE 1 TABLET(10 MG) BY MOUTH DAILY (Patient not taking: Reported on 04/08/2021)   No facility-administered encounter medications on file as of 04/08/2021.    Allergies (verified) Sulfa antibiotics   History: Past Medical History:  Diagnosis Date  . Allergy   . Anxiety   . Epiglottitis 11/13/2015  . History of UTI   . Kidney stones   . Panic attacks 08/23/2014  . Right lower lobe pneumonia 10/11/2011  . Supraglottitis 11/13/2015   Past Surgical History:  Procedure Laterality Date  . EYE SURGERY  2019   cataract removal Feb and March  . NO PAST SURGERIES    . TUBAL LIGATION  1988  . URETHRAL SLING  05/09/2019   Family History  Problem Relation Age of Onset  . Arthritis Father   . Heart disease Father   . Hypertension Other        parent  . Stroke Other   . Hyperlipidemia Other    Social History   Socioeconomic History  . Marital status: Married    Spouse name: Not on file  . Number of children: 4  . Years of education: Not on file  . Highest education level: Not on file  Occupational History  . Occupation: Editor, commissioning    Comment: retired  Tobacco Use  . Smoking status: Never Smoker  . Smokeless tobacco: Never Used  Vaping Use  . Vaping Use: Never used  Substance and Sexual Activity  . Alcohol use: No  . Drug use: No  . Sexual activity: Not on file  Other Topics Concern  . Not on file  Social History Narrative   HHof 2    Retired Investment banker, operationalaMAth teacher    BS degree   g4 p4    Neg tad husband smokes but no ets.  Now  stopped       01/26/19: Lives with husband in 2 story house   Has two daughters, two sons, all of whom live locally with grandkids. Active in their care.   [facility] doing elliptical, bike, weight training    Before covid active after but not yet as much       Social Determinants of Health   Financial  Resource Strain: Low Risk   . Difficulty of Paying Living Expenses: Not hard at all  Food Insecurity: No Food Insecurity  . Worried About Programme researcher, broadcasting/film/videounning Out of Food in the Last Year: Never true  . Ran Out of Food in the Last Year: Never true  Transportation Needs: No Transportation Needs  . Lack of Transportation (Medical): No  . Lack of Transportation (Non-Medical): No  Physical Activity: Sufficiently Active  . Days of Exercise per Week: 5 days  . Minutes of Exercise per Session: 60 min  Stress: No Stress Concern Present  . Feeling of Stress : Not at all  Social Connections: Socially Integrated  . Frequency of Communication with Friends and Family: More than three times a week  . Frequency of Social Gatherings with Friends and Family: More than three times a week  . Attends Religious Services: 1 to 4 times per year  . Active Member of Clubs or Organizations: Yes  . Attends BankerClub or Organization Meetings: 1 to 4 times per year  . Marital Status: Married    Tobacco Counseling Counseling given: Not Answered   Clinical Intake:  Pre-visit preparation completed: No  Pain : No/denies pain     Nutritional Risks: None Diabetes: No  How often do you need to have someone help you when you read instructions, pamphlets, or other written materials from your doctor or pharmacy?: 1 - Never What is the last grade level you completed in school?: college  Diabetic?no  Interpreter Needed?: No  Information entered by :: L.Vier,Lpn   Activities of Daily Living In your present state of health, do you have any difficulty performing the following activities: 04/08/2021  Hearing? N  Vision? N  Difficulty concentrating or making decisions? N  Walking or climbing stairs? N  Dressing or bathing? N  Doing errands, shopping? N  Preparing Food and eating ? N  Using the Toilet? N  In the past six months, have you accidently leaked urine? N  Do you have problems with loss of bowel control? N   Managing your Medications? N  Managing your Finances? N  Housekeeping or managing your Housekeeping? N  Some recent data might be hidden    Patient Care Team: Panosh, Neta MendsWanda K, MD as PCP - General (Internal Medicine) Nita SellsHall, John, MD (Dermatology) Serena Colonelosen, Jefry, MD as Consulting Physician (Otolaryngology) Daisy Lazarotter, Mark, DO (Optometry)  Indicate any recent Medical Services you may have received from other than [facility] providers in the past year (date may be approximate).     Assessment:   This is a routine wellness examination for Liborio NixonJanice.  Hearing/Vision screen  Hearing Screening   125Hz  250Hz  500Hz  1000Hz  2000Hz  3000Hz  4000Hz  6000Hz  8000Hz   Right ear:  Left ear:           Vision Screening Comments: Annual eye exams wears contacts   Dietary issues and exercise activities discussed: Current Exercise Habits: Home exercise routine, Type of exercise: walking, Time (Minutes): 60, Frequency (Times/Week): 5, Weekly Exercise (Minutes/Week): 300, Intensity: Mild, Exercise limited by: None identified  Goals Addressed            This Visit's Progress   . Patient Stated   On track    Lose more weight to get to goal of 175lbs by next year!      Depression Screen PHQ 2/9 Scores 04/08/2021 04/04/2020 07/13/2019 01/26/2019 09/23/2017 04/30/2016 04/24/2015  PHQ - 2 Score 0 0 0 0 0 0 0  PHQ- 9 Score - 0 - 0 - - -    Fall Risk Fall Risk  04/08/2021 04/04/2020 07/13/2019 01/26/2019 09/23/2017  Falls in the past year? 0 0 0 0 No  Number falls in past yr: 0 - 0 - -  Injury with Fall? 0 - 0 - -    FALL RISK PREVENTION PERTAINING TO THE HOME:  Any stairs in or around the home? Yes  If so, are there any without handrails? Yes  Home free of loose throw rugs in walkways, pet beds, electrical cords, etc? Yes  Adequate lighting in your home to reduce risk of falls? Yes   ASSISTIVE DEVICES UTILIZED TO PREVENT FALLS:  Life alert? No  Use of a cane, walker or w/c? No  Grab bars in the bathroom? Yes   Shower chair or bench in shower? Yes  Elevated toilet seat or a handicapped toilet? Yes     Cognitive Function:       Normal cognitive status assessed by direct observation by this Nurse Health Advisor. No abnormalities found.    Immunizations Immunization History  Administered Date(s) Administered  . Pneumococcal Conjugate-13 03/08/2014  . Pneumococcal Polysaccharide-23 04/24/2015  . Td 09/23/2017  . Zoster 10/06/2012  . Zoster Recombinat (Shingrix) 10/01/2017, 10/31/2017, 12/07/2017    TDAP status: Up to date  Flu Vaccine status: Up to date  Pneumococcal vaccine status: Up to date  Covid-19 vaccine status: Declined, Education has been provided regarding the importance of this vaccine but patient still declined. Advised may receive this vaccine at local pharmacy or Health Dept.or vaccine clinic. Aware to provide a copy of the vaccination record if obtained from local pharmacy or Health Dept. Verbalized acceptance and understanding.  Qualifies for Shingles Vaccine? Yes   Zostavax completed No   Shingrix Completed?: Yes  Screening Tests Health Maintenance  Topic Date Due  . COVID-19 Vaccine (1) Never done  . COLON CANCER SCREENING ANNUAL FOBT  03/24/2017  . INFLUENZA VACCINE  07/08/2021  . MAMMOGRAM  09/12/2021  . TETANUS/TDAP  09/24/2027  . DEXA SCAN  Completed  . Hepatitis C Screening  Completed  . PNA vac Low Risk Adult  Completed  . HPV VACCINES  Aged Out  . COLONOSCOPY (Pts 45-19yrs Insurance coverage will need to be confirmed)  Discontinued    Health Maintenance  Health Maintenance Due  Topic Date Due  . COVID-19 Vaccine (1) Never done  . COLON CANCER SCREENING ANNUAL FOBT  03/24/2017    Colorectal cancer screening: Referral to GI placed 04/08/2021. Pt aware the office will call re: appt.  Mammogram status: Ordered 04/08/2021. Pt provided with contact info and advised to call to schedule appt.   Bone Density status: Completed 07/21/2019. Results  reflect: Bone density results: OSTEOPENIA. Repeat every  3 years.  Lung Cancer Screening: (Low Dose CT Chest recommended if Age 35-80 years, 30 pack-year currently smoking OR have quit w/in 15years.) does not qualify.   Lung Cancer Screening Referral: n/a  Additional Screening:  Hepatitis C Screening: does qualify; Completed 08/25/2016  Vision Screening: Recommended annual ophthalmology exams for early detection of glaucoma and other disorders of the eye. Is the patient up to date with their annual eye exam?  Yes  Who is the provider or what is the name of the office in which the patient attends annual eye exams? [facility]  If pt is not established with a provider, would they like to be referred to a provider to establish care? No .   Dental Screening: Recommended annual dental exams for proper oral hygiene  Community Resource Referral / Chronic Care Management: CRR required this visit?  No   CCM required this visit?  No      Plan:     I have personally reviewed and noted the following in the patient's chart:   . Medical and social history . Use of alcohol, tobacco or illicit drugs  . Current medications and supplements including opioid prescriptions.  . Functional ability and status . Nutritional status . Physical activity . Advanced directives . List of other physicians . Hospitalizations, surgeries, and ER visits in previous 12 months . Vitals . Screenings to include cognitive, depression, and falls . Referrals and appointments  In addition, I have reviewed and discussed with patient certain preventive protocols, quality metrics, and best practice recommendations. A written personalized care plan for preventive services as well as general preventive health recommendations were provided to patient.     March Rummage, LPN   07/08/2750   Nurse Notes: none

## 2021-04-16 ENCOUNTER — Telehealth: Payer: Self-pay

## 2021-04-16 NOTE — Telephone Encounter (Signed)
error 

## 2021-04-18 ENCOUNTER — Telehealth: Payer: Self-pay

## 2021-04-18 NOTE — Telephone Encounter (Signed)
I contacted patient to inform her of her expired Cologuard. Patient stated that she contacted her insurance and they will be sending her a new Cologuard kit through mail service.

## 2021-05-16 LAB — FECAL OCCULT BLOOD, IMMUNOCHEMICAL: IFOBT: NEGATIVE

## 2021-05-22 ENCOUNTER — Other Ambulatory Visit: Payer: Self-pay

## 2021-05-22 ENCOUNTER — Encounter: Payer: Self-pay | Admitting: Internal Medicine

## 2021-05-22 ENCOUNTER — Ambulatory Visit (INDEPENDENT_AMBULATORY_CARE_PROVIDER_SITE_OTHER): Payer: Medicare PPO | Admitting: Internal Medicine

## 2021-05-22 VITALS — BP 134/80 | HR 80 | Temp 98.0°F | Ht 66.0 in | Wt 195.2 lb

## 2021-05-22 DIAGNOSIS — Z79899 Other long term (current) drug therapy: Secondary | ICD-10-CM | POA: Diagnosis not present

## 2021-05-22 DIAGNOSIS — Z6831 Body mass index (BMI) 31.0-31.9, adult: Secondary | ICD-10-CM | POA: Diagnosis not present

## 2021-05-22 DIAGNOSIS — E785 Hyperlipidemia, unspecified: Secondary | ICD-10-CM

## 2021-05-22 DIAGNOSIS — G479 Sleep disorder, unspecified: Secondary | ICD-10-CM | POA: Diagnosis not present

## 2021-05-22 DIAGNOSIS — Z0000 Encounter for general adult medical examination without abnormal findings: Secondary | ICD-10-CM

## 2021-05-22 DIAGNOSIS — E669 Obesity, unspecified: Secondary | ICD-10-CM

## 2021-05-22 MED ORDER — LORAZEPAM 0.5 MG PO TABS: 0.5000 mg | ORAL_TABLET | Freq: Two times a day (BID) | ORAL | 0 refills | Status: DC | PRN

## 2021-05-22 NOTE — Progress Notes (Signed)
Chief Complaint  Patient presents with   Annual Exam     HPI: Heather Lamb 75 y.o. comes in today for Preventive Medicare exam/ wellness visit .Since last visit.  12   loss pounds since   January .  Had gained a lot over  covid shut down   Has been off lexapro for a while but thinks restarting or such will help her anxiety jttereiness startleing and some night wakening's    ocass rare panic  would like to have lorazepam on hand in case but not using it . Last rx a year ago.   Utd on mammo   Health Maintenance  Topic Date Due   COVID-19 Vaccine (1) Never done   COLON CANCER SCREENING ANNUAL FOBT  03/24/2017   INFLUENZA VACCINE  07/08/2021   MAMMOGRAM  09/12/2021   TETANUS/TDAP  09/24/2027   DEXA SCAN  Completed   Hepatitis C Screening  Completed   PNA vac Low Risk Adult  Completed   Zoster Vaccines- Shingrix  Completed   HPV VACCINES  Aged Out   COLONOSCOPY (Pts 45-24yrs Insurance coverage will need to be confirmed)  Discontinued   Health Maintenance Review LIFESTYLE:  Exercise:   walking 25 - 30 per week.  Tobacco/ETS:n Alcohol: limited  Sugar beverages:n Sleep: interrupted  Drug use: no HH:2  helps with  grandkids    Hearing:  ok Vision:  No limitations at present . Last eye check UTD Safety:  Has smoke detector and wears seat belts.   No excess sun exposure. Sees dentist regularly. Falls:  no Memory: Felt to be good  , no concern from her or her family. Depression: No anhedonia unusual crying or depressive symptoms anxious   jittery at times.  Nutrition: Eats well balanced diet; adequate calcium and vitamin D. No swallowing chewing problems. Injury: no major injuries in the last six months. Had ankle issues healed on own didn't do surgery Other healthcare providers:  Reviewed today .  Preventive parameters: up-to-date  Reviewed  ADLS:   There are no problems or need for assistance  driving, feeding, obtaining food, dressing, toileting and bathing, managing  money using phone. She is independent.    ROS:  GEN/ HEENT: No fever, significant weight changes sweats headaches vision problems hearing changes, CV/ PULM; No chest pain shortness of breath cough, syncope,edema  change in exercise tolerance. GI /GU: No adominal pain, vomiting, change in bowel habits. No blood in the stool. No significant GU symptoms. SKIN/HEME: ,no acute skin rashes suspicious lesions or bleeding. No lymphadenopathy, nodules, masses.  NEURO/ PSYCH:  No neurologic signs such as weakness numbness. No depression anxiety. IMM/ Allergy: No unusual infections.  Allergy .   REST of 12 system review negative except as per HPI   Past Medical History:  Diagnosis Date   Allergy    Anxiety    Epiglottitis 11/13/2015   History of UTI    Kidney stones    Panic attacks 08/23/2014   Right lower lobe pneumonia 10/11/2011   Supraglottitis 11/13/2015    Family History  Problem Relation Age of Onset   Arthritis Father    Heart disease Father    Hypertension Other        parent   Stroke Other    Hyperlipidemia Other     Social History   Socioeconomic History   Marital status: Married    Spouse name: Not on file   Number of children: 4   Years of education: Not on  file   Highest education level: Not on file  Occupational History   Occupation: Editor, commissioning    Comment: retired  Tobacco Use   Smoking status: Never   Smokeless tobacco: Never  Vaping Use   Vaping Use: Never used  Substance and Sexual Activity   Alcohol use: No   Drug use: No   Sexual activity: Not on file  Other Topics Concern   Not on file  Social History Narrative   HHof 2    Retired Teacher, early years/pre    BS degree   g4 p4    Neg tad husband smokes but no ets.  Now  stopped       01/26/19: Lives with husband in 2 story house   Has two daughters, two sons, all of whom live locally with grandkids. Active in their care.   Exercises at [facility] doing elliptical, bike, weight training    Before covid active  after but not yet as much       Social Determinants of Health   Financial Resource Strain: Low Risk    Difficulty of Paying Living Expenses: Not hard at all  Food Insecurity: No Food Insecurity   Worried About Programme researcher, broadcasting/film/video in the Last Year: Never true   Ran Out of Food in the Last Year: Never true  Transportation Needs: No Transportation Needs   Lack of Transportation (Medical): No   Lack of Transportation (Non-Medical): No  Physical Activity: Sufficiently Active   Days of Exercise per Week: 5 days   Minutes of Exercise per Session: 60 min  Stress: No Stress Concern Present   Feeling of Stress : Not at all  Social Connections: Socially Integrated   Frequency of Communication with Friends and Family: More than three times a week   Frequency of Social Gatherings with Friends and Family: More than three times a week   Attends Religious Services: 1 to 4 times per year   Active Member of [employer] or Organizations: Yes   Attends Banker Meetings: 1 to 4 times per year   Marital Status: Married    Outpatient Encounter Medications as of 05/22/2021  Medication Sig   calcium carbonate (OS-CAL) 600 MG TABS tablet Take 600 mg by mouth 2 (two) times daily with a meal. With vit D3   Cholecalciferol (VITAMIN D PO) Take 1 tablet by mouth daily.   escitalopram (LEXAPRO) 10 MG tablet TAKE 1 TABLET(10 MG) BY MOUTH DAILY   valACYclovir (VALTREX) 1000 MG tablet TAKE 2 TABLETS(2000 MG) BY MOUTH TWICE DAILY FOR COLD SORES   [DISCONTINUED] LORazepam (ATIVAN) 0.5 MG tablet Take 1 tablet (0.5 mg total) by mouth 2 (two) times daily as needed for anxiety.   LORazepam (ATIVAN) 0.5 MG tablet Take 1 tablet (0.5 mg total) by mouth 2 (two) times daily as needed for anxiety.   No facility-administered encounter medications on file as of 05/22/2021.    EXAM:  BP 134/80 (BP Location: Left Arm, Patient Position: Sitting, Cuff Size: Normal)   Pulse 80   Temp 98 F (36.7 C) (Oral)   Ht 5\' 6"   (1.676 m)   Wt 195 lb 3.2 oz (88.5 kg)   SpO2 96%   BMI 31.51 kg/m   Body mass index is 31.51 kg/m.  Physical Exam: Vital signs reviewed is a well-developed well-nourished alert cooperative   who appears stated age in no acute distress.  HEENT: normocephalic atraumatic , Eyes: PERRL EOM's full, conjunctiva clear, Nares: paten,t no deformity discharge  or tenderness., Ears: no deformity EAC's clear TMs with normal landmarks. Mouth: masked  NECK: supple without masses, thyromegaly or bruits. CHEST/PULM:  Clear to auscultation and percussion breath sounds equal no wheeze , rales or rhonchi. No chest wall deformities or tenderness. CV: PMI is nondisplaced, S1 S2 no gallops, murmurs, rubs. Peripheral pulses are full without delay.No JVD .  ABDOMEN: Bowel sounds normal nontender  No guard or rebound, no hepato splenomegal no CVA tenderness.   Extremtities:  No clubbing cyanosis or edema, no acute joint swelling or redness no focal atrophy NEURO:  Oriented x3, cranial nerves 3-12 appear to be intact, no obvious focal weakness,gait within normal limits no abnormal reflexes or asymmetrical SKIN: No acute rashes normal turgor, color, no bruising or petechiae. PSYCH: Oriented, good eye contact, no obvious depression anxiety, cognition and judgment appear normal. LN: no cervical axillary inguinal adenopathy No noted deficits in memory, attention, and speech.   Lab Results  Component Value Date   WBC 5.7 07/21/2019   HGB 14.4 07/21/2019   HCT 43.3 07/21/2019   PLT 244.0 07/21/2019   GLUCOSE 110 (H) 07/21/2019   CHOL 207 (H) 07/21/2019   TRIG 158.0 (H) 07/21/2019   HDL 43.00 07/21/2019   LDLCALC 132 (H) 07/21/2019   ALT 24 07/21/2019   AST 20 07/21/2019   NA 140 07/21/2019   K 4.8 07/21/2019   CL 100 07/21/2019   CREATININE 0.62 07/21/2019   BUN 18 07/21/2019   CO2 31 07/21/2019   TSH 1.69 07/21/2019   HGBA1C 5.9 04/30/2016    ASSESSMENT AND PLAN:  Discussed the following  assessment and plan:  Visit for preventive health examination  Medication management - Plan: Basic metabolic panel, CBC with Differential/Platelet, Hemoglobin A1c, Hepatic function panel, Lipid panel, TSH, T4, free  Hyperlipidemia, unspecified hyperlipidemia type - Plan: Basic metabolic panel, CBC with Differential/Platelet, Hemoglobin A1c, Hepatic function panel, Lipid panel, TSH, T4, free  Disordered sleep - Plan: Basic metabolic panel, CBC with Differential/Platelet, Hemoglobin A1c, Hepatic function panel, Lipid panel, TSH, T4, free  Class 1 obesity with body mass index (BMI) of 31.0 to 31.9 in adult, unspecified obesity type, unspecified whether serious comorbidity present Colon screen had some  to be sent .  Sleep anxiety can try begin back los dose 5 mg lexapro inc to 10 as tolerated.  Continue lifestyle intervention healthy eating and exercise . As she has lost about 12 # since Jan with her self program.  Can get fasting lab  to include thyroid  Patient Care Team: Warwick Nick, Neta Mends, MD as PCP - General (Internal Medicine) Nita Sells, MD (Dermatology) Serena Colonel, MD as Consulting Physician (Otolaryngology) Daisy Lazar, DO [facility])  Patient Instructions  Can try beginning  5 mg  of lexapro per day  and after a few weeks if needed inc to 10 mg   Let us know how helping in 2-3 months or other  virtual visit or  messaging.   Continue lifestyle intervention healthy eating and exercise .   Fasting lab appt   to include thyroid.   Can do updated dexa scan next year   Health Maintenance, Female Adopting a healthy lifestyle and getting preventive care are important in promoting health and wellness. Ask your health care provider about: The right schedule for you to have regular tests and exams. Things you can do on your own to prevent diseases and keep yourself healthy. What should I know about diet, weight, and exercise? Eat a healthy diet  Eat a  diet that includes plenty of  vegetables, fruits, low-fat dairy products, and lean protein. Do not eat a lot of foods that are high in solid fats, added sugars, or sodium.  Maintain a healthy weight Body mass index (BMI) is used to identify weight problems. It estimates body fat based on height and weight. Your health care provider can help determineyour BMI and help you achieve or maintain a healthy weight. Get regular exercise Get regular exercise. This is one of the most important things you can do for your health. Most adults should: Exercise for at least 150 minutes each week. The exercise should increase your heart rate and make you sweat (moderate-intensity exercise). Do strengthening exercises at least twice a week. This is in addition to the moderate-intensity exercise. Spend less time sitting. Even light physical activity can be beneficial. Watch cholesterol and blood lipids Have your blood tested for lipids and cholesterol at 75 years of age, then havethis test every 5 years. Have your cholesterol levels checked more often if: Your lipid or cholesterol levels are high. You are older than 73 years of age. You are at high risk for heart disease. What should I know about cancer screening? Depending on your health history and family history, you may need to have cancer screening at various ages. This may include screening for: Breast cancer. Cervical cancer. Colorectal cancer. Skin cancer. Lung cancer. What should I know about heart disease, diabetes, and high blood pressure? Blood pressure and heart disease High blood pressure causes heart disease and increases the risk of stroke. This is more likely to develop in people who have high blood pressure readings, are of African descent, or are overweight. Have your blood pressure checked: Every 3-5 years if you are 6518-73 years of age. Every year if you are 73 years old or older. Diabetes Have regular diabetes screenings. This checks your fasting blood sugar  level. Have the screening done: Once every three years after age 73 if you are at a normal weight and have a low risk for diabetes. More often and at a younger age if you are overweight or have a high risk for diabetes. What should I know about preventing infection? Hepatitis B If you have a higher risk for hepatitis B, you should be screened for this virus. Talk with your health care provider to find out if you are at risk forhepatitis B infection. Hepatitis C Testing is recommended for: Everyone born from [id number] through 1963. Anyone with known risk factors for hepatitis C. Sexually transmitted infections (STIs) Get screened for STIs, including gonorrhea and chlamydia, if: You are sexually active and are younger than 73 years of age. You are older than 73 years of age and your health care provider tells you that you are at risk for this type of infection. Your sexual activity has changed since you were last screened, and you are at increased risk for chlamydia or gonorrhea. Ask your health care provider if you are at risk. Ask your health care provider about whether you are at high risk for HIV. Your health care provider may recommend a prescription medicine to help prevent HIV infection. If you choose to take medicine to prevent HIV, you should first get tested for HIV. You should then be tested every 3 months for as long as you are taking the medicine. Pregnancy If you are about to stop having your period (premenopausal) and you may become pregnant, seek counseling before you get pregnant. Take 400 to 800 micrograms (mcg)  of folic acid every day if you become pregnant. Ask for birth control (contraception) if you want to prevent pregnancy. Osteoporosis and menopause Osteoporosis is a disease in which the bones lose minerals and strength with aging. This can result in bone fractures. If you are 6 years old or older, or if you are at risk for osteoporosis and fractures, ask your health care  provider if you should: Be screened for bone loss. Take a calcium or vitamin D supplement to lower your risk of fractures. Be given hormone replacement therapy (HRT) to treat symptoms of menopause. Follow these instructions at home: Lifestyle Do not use any products that contain nicotine or tobacco, such as cigarettes, e-cigarettes, and chewing tobacco. If you need help quitting, ask your health care provider. Do not use street drugs. Do not share needles. Ask your health care provider for help if you need support or information about quitting drugs. Alcohol use Do not drink alcohol if: Your health care provider tells you not to drink. You are pregnant, may be pregnant, or are planning to become pregnant. If you drink alcohol: Limit how much you use to 0-1 drink a day. Limit intake if you are breastfeeding. Be aware of how much alcohol is in your drink. In the [place] one drink equals one 12 oz bottle of beer (355 mL), one 5 oz glass of wine (148 mL), or one 1 oz glass of hard liquor (44 mL). General instructions Schedule regular health, dental, and eye exams. Stay current with your vaccines. Tell your health care provider if: You often feel depressed. You have ever been abused or do not feel safe at home. Summary Adopting a healthy lifestyle and getting preventive care are important in promoting health and wellness. Follow your health care provider's instructions about healthy diet, exercising, and getting tested or screened for diseases. Follow your health care provider's instructions on monitoring your cholesterol and blood pressure. This information is not intended to replace advice given to you by your health care provider. Make sure you discuss any questions you have with your healthcare provider. Document Revised: 11/17/2018 Document Reviewed: 11/17/2018 Elsevier Patient Education  2022 [employer].  [place]. [place] Dinneen M.D.

## 2021-05-22 NOTE — Patient Instructions (Signed)
Can try beginning  5 mg  of lexapro per day  and after a few weeks if needed inc to 10 mg   Let us know how helping in 2-3 months or other  virtual visit or  messaging.   Continue lifestyle intervention healthy eating and exercise .   Fasting lab appt   to include thyroid.   Can do updated dexa scan next year   Health Maintenance, Female Adopting a healthy lifestyle and getting preventive care are important in promoting health and wellness. Ask your health care provider about: The right schedule for you to have regular tests and exams. Things you can do on your own to prevent diseases and keep yourself healthy. What should I know about diet, weight, and exercise? Eat a healthy diet  Eat a diet that includes plenty of vegetables, fruits, low-fat dairy products, and lean protein. Do not eat a lot of foods that are high in solid fats, added sugars, or sodium.  Maintain a healthy weight Body mass index (BMI) is used to identify weight problems. It estimates body fat based on height and weight. Your health care provider can help determineyour BMI and help you achieve or maintain a healthy weight. Get regular exercise Get regular exercise. This is one of the most important things you can do for your health. Most adults should: Exercise for at least 150 minutes each week. The exercise should increase your heart rate and make you sweat (moderate-intensity exercise). Do strengthening exercises at least twice a week. This is in addition to the moderate-intensity exercise. Spend less time sitting. Even light physical activity can be beneficial. Watch cholesterol and blood lipids Have your blood tested for lipids and cholesterol at 75 years of age, then havethis test every 5 years. Have your cholesterol levels checked more often if: Your lipid or cholesterol levels are high. You are older than 73 years of age. You are at high risk for heart disease. What should I know about cancer  screening? Depending on your health history and family history, you may need to have cancer screening at various ages. This may include screening for: Breast cancer. Cervical cancer. Colorectal cancer. Skin cancer. Lung cancer. What should I know about heart disease, diabetes, and high blood pressure? Blood pressure and heart disease High blood pressure causes heart disease and increases the risk of stroke. This is more likely to develop in people who have high blood pressure readings, are of African descent, or are overweight. Have your blood pressure checked: Every 3-5 years if you are 6-9 years of age. Every year if you are 62 years old or older. Diabetes Have regular diabetes screenings. This checks your fasting blood sugar level. Have the screening done: Once every three years after age 19 if you are at a normal weight and have a low risk for diabetes. More often and at a younger age if you are overweight or have a high risk for diabetes. What should I know about preventing infection? Hepatitis B If you have a higher risk for hepatitis B, you should be screened for this virus. Talk with your health care provider to find out if you are at risk forhepatitis B infection. Hepatitis C Testing is recommended for: Everyone born from [birth year] through 1963. Anyone with known risk factors for hepatitis C. Sexually transmitted infections (STIs) Get screened for STIs, including gonorrhea and chlamydia, if: You are sexually active and are younger than 73 years of age. You are older than 73 years of age  and your health care provider tells you that you are at risk for this type of infection. Your sexual activity has changed since you were last screened, and you are at increased risk for chlamydia or gonorrhea. Ask your health care provider if you are at risk. Ask your health care provider about whether you are at high risk for HIV. Your health care provider may recommend a prescription medicine to  help prevent HIV infection. If you choose to take medicine to prevent HIV, you should first get tested for HIV. You should then be tested every 3 months for as long as you are taking the medicine. Pregnancy If you are about to stop having your period (premenopausal) and you may become pregnant, seek counseling before you get pregnant. Take 400 to 800 micrograms (mcg) of folic acid every day if you become pregnant. Ask for birth control (contraception) if you want to prevent pregnancy. Osteoporosis and menopause Osteoporosis is a disease in which the bones lose minerals and strength with aging. This can result in bone fractures. If you are 39 years old or older, or if you are at risk for osteoporosis and fractures, ask your health care provider if you should: Be screened for bone loss. Take a calcium or vitamin D supplement to lower your risk of fractures. Be given hormone replacement therapy (HRT) to treat symptoms of menopause. Follow these instructions at home: Lifestyle Do not use any products that contain nicotine or tobacco, such as cigarettes, e-cigarettes, and chewing tobacco. If you need help quitting, ask your health care provider. Do not use street drugs. Do not share needles. Ask your health care provider for help if you need support or information about quitting drugs. Alcohol use Do not drink alcohol if: Your health care provider tells you not to drink. You are pregnant, may be pregnant, or are planning to become pregnant. If you drink alcohol: Limit how much you use to 0-1 drink a day. Limit intake if you are breastfeeding. Be aware of how much alcohol is in your drink. In the [place] one drink equals one 12 oz bottle of beer (355 mL), one 5 oz glass of wine (148 mL), or one 1 oz glass of hard liquor (44 mL). General instructions Schedule regular health, dental, and eye exams. Stay current with your vaccines. Tell your health care provider if: You often feel depressed. You  have ever been abused or do not feel safe at home. Summary Adopting a healthy lifestyle and getting preventive care are important in promoting health and wellness. Follow your health care provider's instructions about healthy diet, exercising, and getting tested or screened for diseases. Follow your health care provider's instructions on monitoring your cholesterol and blood pressure. This information is not intended to replace advice given to you by your health care provider. Make sure you discuss any questions you have with your healthcare provider. Document Revised: 11/17/2018 Document Reviewed: 11/17/2018 Elsevier Patient Education  2022 [employer].

## 2021-06-05 ENCOUNTER — Encounter: Payer: Self-pay | Admitting: Internal Medicine

## 2021-06-17 ENCOUNTER — Encounter: Payer: Self-pay | Admitting: Internal Medicine

## 2021-06-17 ENCOUNTER — Telehealth (INDEPENDENT_AMBULATORY_CARE_PROVIDER_SITE_OTHER): Payer: Medicare PPO | Admitting: Internal Medicine

## 2021-06-17 VITALS — Temp 96.5°F | Ht 66.0 in | Wt 195.2 lb

## 2021-06-17 DIAGNOSIS — R5383 Other fatigue: Secondary | ICD-10-CM | POA: Diagnosis not present

## 2021-06-17 DIAGNOSIS — R6883 Chills (without fever): Secondary | ICD-10-CM

## 2021-06-17 DIAGNOSIS — Z20822 Contact with and (suspected) exposure to covid-19: Secondary | ICD-10-CM | POA: Diagnosis not present

## 2021-06-17 NOTE — Progress Notes (Signed)
Virtual Visit via Video Note  I connected withNAME@ on 06/17/21 at  8:30 AM EDT by a video enabled telemedicine application and verified that I am speaking with the correct person using two identifiers. Location patient: home Location provider:work office Persons participating in the virtual visit: patient, provider  [place] national recommendations  regarding COVID 19 pandemic   video visit is advised over in office visit for this patient.  Patient aware  of the limitations of evaluation and management by telemedicine and  availability of in person appointments. and agreed to proceed.   HPI: Heather Lamb presents for video visit for new but ongoing symptoms. She has had what is characterized to extreme fatigue without fever since after her last visit here June 15. Than last week onset of feeling cold chills with no fever that lasted 4 to 5 days and is since improved.  She took her temp never had oral fever in the 96 range.  Never developed a rash with the chills. She did have a tick bite off the back of her leg after around [facility] Day had a small ring of rash around it but no systemic rash. No joint swelling continued fatigue family has not been ill with anything similar nor contacted [place] knowingly. She has had no COVID vaccinations. No cough runny nose significant GI symptoms decreased appetite. She is taken to rapid COVID test both negative last 1 last week. Has a remote history of being treated for Lyme disease years ago with doxycycline.  She has taken some ibuprofen which helps her feel better when she takes it. ROS: See pertinent positives and negatives per HPI.  No UTI symptoms.  Past Medical History:  Diagnosis Date   Allergy    Anxiety    Epiglottitis 11/13/2015   History of UTI    Kidney stones    Panic attacks 08/23/2014   Right lower lobe pneumonia 10/11/2011   Supraglottitis 11/13/2015    Past Surgical History:  Procedure Laterality Date   EYE SURGERY  2019    cataract removal Feb and March   NO PAST SURGERIES     TUBAL LIGATION  1988   URETHRAL SLING  05/09/2019    Family History  Problem Relation Age of Onset   Arthritis Father    Heart disease Father    Hypertension Other        parent   Stroke Other    Hyperlipidemia Other     Social History   Tobacco Use   Smoking status: Never   Smokeless tobacco: Never  Vaping Use   Vaping Use: Never used  Substance Use Topics   Alcohol use: No   Drug use: No      Current Outpatient Medications:    calcium carbonate (OS-CAL) 600 MG TABS tablet, Take 600 mg by mouth 2 (two) times daily with a meal. With vit D3, Disp: , Rfl:    Cholecalciferol (VITAMIN D PO), Take 1 tablet by mouth daily., Disp: , Rfl:    escitalopram (LEXAPRO) 10 MG tablet, TAKE 1 TABLET(10 MG) BY MOUTH DAILY, Disp: 90 tablet, Rfl: 1   LORazepam (ATIVAN) 0.5 MG tablet, Take 1 tablet (0.5 mg total) by mouth 2 (two) times daily as needed for anxiety., Disp: 24 tablet, Rfl: 0   valACYclovir (VALTREX) 1000 MG tablet, TAKE 2 TABLETS(2000 MG) BY MOUTH TWICE DAILY FOR COLD SORES, Disp: 30 tablet, Rfl: 3  EXAM: BP Readings from Last 3 Encounters:  05/22/21 134/80  04/04/20 126/82  07/13/19 122/74  VITALS per patient if applicable:  GENERAL: alert, oriented, appears well and in no acute distress looks well nonicteric  HEENT: atraumatic, conjunttiva clear, no obvious abnormalities on inspection of external nose and ears  NECK: normal movements of the head and neck  LUNGS: on inspection no signs of respiratory distress, breathing rate appears normal, no obvious gross SOB, gasping or wheezing  CV: no obvious cyanosis  MS: moves all visible extremities without noticeable abnormality  PSYCH/NEURO: pleasant and cooperative, no obvious depression or anxiety, speech and thought processing grossly intact Lab Results  Component Value Date   WBC 5.7 07/21/2019   HGB 14.4 07/21/2019   HCT 43.3 07/21/2019   PLT 244.0  07/21/2019   GLUCOSE 110 (H) 07/21/2019   CHOL 207 (H) 07/21/2019   TRIG 158.0 (H) 07/21/2019   HDL 43.00 07/21/2019   LDLCALC 132 (H) 07/21/2019   ALT 24 07/21/2019   AST 20 07/21/2019   NA 140 07/21/2019   K 4.8 07/21/2019   CL 100 07/21/2019   CREATININE 0.62 07/21/2019   BUN 18 07/21/2019   CO2 31 07/21/2019   TSH 1.69 07/21/2019   HGBA1C 5.9 04/30/2016    ASSESSMENT AND PLAN:  Discussed the following assessment and plan:    ICD-10-CM   1. Chills (without fever)  R68.83 Sedimentation rate    C-reactive protein    SARS-CoV-2 Antibodies    POCT Urinalysis Dipstick (Automated)    2. Other fatigue  R53.83    hx tick bite  fatigue predates days of chills.      Ongoing fatigue but recent episode of chills without documented fever improved.  No typical rash for EM or RMSF Despite negative rapid tests COVID still possible Consider tick related disease and empiric treatment for Lyme if no other obvious cause Get a PCR COVID test today fasting lab appointment orders in system we will add CRP ESR and COVID antibodies and she has not been immunized Follow-up depending consider adding empiric treatment with doxycycline for 2 weeks. Counseled.   Expectant management and discussion of plan and treatment with opportunity to ask questions and all were answered. The patient agreed with the plan and demonstrated an understanding of the instructions.   Advised to call back or seek an in-person evaluation if worsening  or having  further concerns . Return for depending on results and treatment .    Shanon Ace, MD

## 2021-06-18 ENCOUNTER — Other Ambulatory Visit: Payer: Self-pay | Admitting: Internal Medicine

## 2021-06-18 ENCOUNTER — Other Ambulatory Visit: Payer: Self-pay

## 2021-06-18 ENCOUNTER — Other Ambulatory Visit (INDEPENDENT_AMBULATORY_CARE_PROVIDER_SITE_OTHER): Payer: Medicare PPO

## 2021-06-18 DIAGNOSIS — Z79899 Other long term (current) drug therapy: Secondary | ICD-10-CM | POA: Diagnosis not present

## 2021-06-18 DIAGNOSIS — G479 Sleep disorder, unspecified: Secondary | ICD-10-CM

## 2021-06-18 DIAGNOSIS — E785 Hyperlipidemia, unspecified: Secondary | ICD-10-CM

## 2021-06-18 DIAGNOSIS — R6883 Chills (without fever): Secondary | ICD-10-CM

## 2021-06-18 LAB — POC URINALSYSI DIPSTICK (AUTOMATED)
Bilirubin, UA: NEGATIVE
Blood, UA: NEGATIVE
Glucose, UA: NEGATIVE
Ketones, UA: NEGATIVE
Leukocytes, UA: NEGATIVE
Nitrite, UA: NEGATIVE
Protein, UA: NEGATIVE
Spec Grav, UA: 1.015 (ref 1.010–1.025)
Urobilinogen, UA: 0.2 E.U./dL
pH, UA: 6 (ref 5.0–8.0)

## 2021-06-18 LAB — CBC WITH DIFFERENTIAL/PLATELET
Basophils Absolute: 0 10*3/uL (ref 0.0–0.1)
Basophils Relative: 0.8 % (ref 0.0–3.0)
Eosinophils Absolute: 0.1 10*3/uL (ref 0.0–0.7)
Eosinophils Relative: 1 % (ref 0.0–5.0)
HCT: 42 % (ref 36.0–46.0)
Hemoglobin: 14.4 g/dL (ref 12.0–15.0)
Lymphocytes Relative: 31 % (ref 12.0–46.0)
Lymphs Abs: 1.9 10*3/uL (ref 0.7–4.0)
MCHC: 34.3 g/dL (ref 30.0–36.0)
MCV: 79.5 fl (ref 78.0–100.0)
Monocytes Absolute: 0.4 10*3/uL (ref 0.1–1.0)
Monocytes Relative: 7.5 % (ref 3.0–12.0)
Neutro Abs: 3.6 10*3/uL (ref 1.4–7.7)
Neutrophils Relative %: 59.7 % (ref 43.0–77.0)
Platelets: 280 10*3/uL (ref 150.0–400.0)
RBC: 5.28 Mil/uL — ABNORMAL HIGH (ref 3.87–5.11)
RDW: 13.1 % (ref 11.5–15.5)
WBC: 6 10*3/uL (ref 4.0–10.5)

## 2021-06-18 LAB — T4, FREE: Free T4: 1.04 ng/dL (ref 0.60–1.60)

## 2021-06-18 LAB — HEPATIC FUNCTION PANEL
ALT: 13 U/L (ref 0–35)
AST: 12 U/L (ref 0–37)
Albumin: 4.2 g/dL (ref 3.5–5.2)
Alkaline Phosphatase: 95 U/L (ref 39–117)
Bilirubin, Direct: 0.1 mg/dL (ref 0.0–0.3)
Total Bilirubin: 0.5 mg/dL (ref 0.2–1.2)
Total Protein: 6.7 g/dL (ref 6.0–8.3)

## 2021-06-18 LAB — LIPID PANEL
Cholesterol: 191 mg/dL (ref 0–200)
HDL: 38.2 mg/dL — ABNORMAL LOW (ref >39.00)
LDL Cholesterol: 131 mg/dL — ABNORMAL HIGH (ref 0–99)
NonHDL: 152.35
Total CHOL/HDL Ratio: 5
Triglycerides: 105 mg/dL (ref 0.0–149.0)
VLDL: 21 mg/dL (ref 0.0–40.0)

## 2021-06-18 LAB — BASIC METABOLIC PANEL
BUN: 14 mg/dL (ref 6–23)
CO2: 29 mEq/L (ref 19–32)
Calcium: 9.3 mg/dL (ref 8.4–10.5)
Chloride: 102 mEq/L (ref 96–112)
Creatinine, Ser: 0.57 mg/dL (ref 0.40–1.20)
GFR: 90.69 mL/min (ref >60.00)
Glucose, Bld: 107 mg/dL — ABNORMAL HIGH (ref 70–99)
Potassium: 4.4 mEq/L (ref 3.5–5.1)
Sodium: 140 mEq/L (ref 135–145)

## 2021-06-18 LAB — TSH: TSH: 1.44 u[IU]/mL (ref 0.35–5.50)

## 2021-06-18 LAB — HEMOGLOBIN A1C: Hgb A1c MFr Bld: 6.2 % (ref 4.6–6.5)

## 2021-06-18 LAB — C-REACTIVE PROTEIN: CRP: 1.8 mg/dL (ref 0.5–20.0)

## 2021-06-18 LAB — SEDIMENTATION RATE: Sed Rate: 31 mm/hr — ABNORMAL HIGH (ref 0–30)

## 2021-06-18 MED ORDER — DOXYCYCLINE HYCLATE 100 MG PO TABS: 100.0000 mg | ORAL_TABLET | Freq: Two times a day (BID) | ORAL | 0 refills | Status: DC

## 2021-06-18 NOTE — Addendum Note (Signed)
Addended by: Christy Sartorius on: 06/18/2021 01:17 PM   Modules accepted: Orders

## 2021-06-18 NOTE — Telephone Encounter (Signed)
Thanks for the info see the result note your labs are not revealing as to the cause of your symptoms. I sent in doxycycline 10 days worth to your [facility] pharmacy to cover for potential Lyme disease.  Please give [place] a status update after the end of the antibiotic about how you were doing or make an appointment for follow-up.

## 2021-06-18 NOTE — Progress Notes (Signed)
So lab test do not show a good reason for your symptoms. I will send in doxycycline treatment in case the symptoms are tick related disease. Antibodies against COVID are pending since you have not had the vaccine it can tell us if you have had infection in the past but not particularly current. Let me know how you are doing at the end of the medicine.

## 2021-06-19 ENCOUNTER — Other Ambulatory Visit: Payer: Medicare PPO

## 2021-06-19 LAB — SARS-COV-2 ANTIBODIES: SARS-CoV-2 Antibodies: NEGATIVE

## 2021-06-19 NOTE — Progress Notes (Signed)
COVID SARS antibodies are also negative which knows that you have not had any antibody response or past infection with the virus.

## 2021-07-30 ENCOUNTER — Other Ambulatory Visit: Payer: Medicare PPO

## 2021-08-14 DIAGNOSIS — M9903 Segmental and somatic dysfunction of lumbar region: Secondary | ICD-10-CM | POA: Diagnosis not present

## 2021-08-14 DIAGNOSIS — M5441 Lumbago with sciatica, right side: Secondary | ICD-10-CM | POA: Diagnosis not present

## 2021-08-14 DIAGNOSIS — M9902 Segmental and somatic dysfunction of thoracic region: Secondary | ICD-10-CM | POA: Diagnosis not present

## 2021-08-14 DIAGNOSIS — M9905 Segmental and somatic dysfunction of pelvic region: Secondary | ICD-10-CM | POA: Diagnosis not present

## 2021-08-16 DIAGNOSIS — M5441 Lumbago with sciatica, right side: Secondary | ICD-10-CM | POA: Diagnosis not present

## 2021-08-16 DIAGNOSIS — M9905 Segmental and somatic dysfunction of pelvic region: Secondary | ICD-10-CM | POA: Diagnosis not present

## 2021-08-16 DIAGNOSIS — M9903 Segmental and somatic dysfunction of lumbar region: Secondary | ICD-10-CM | POA: Diagnosis not present

## 2021-08-16 DIAGNOSIS — M9902 Segmental and somatic dysfunction of thoracic region: Secondary | ICD-10-CM | POA: Diagnosis not present

## 2021-08-21 DIAGNOSIS — M9902 Segmental and somatic dysfunction of thoracic region: Secondary | ICD-10-CM | POA: Diagnosis not present

## 2021-08-21 DIAGNOSIS — M5441 Lumbago with sciatica, right side: Secondary | ICD-10-CM | POA: Diagnosis not present

## 2021-08-21 DIAGNOSIS — M9905 Segmental and somatic dysfunction of pelvic region: Secondary | ICD-10-CM | POA: Diagnosis not present

## 2021-08-21 DIAGNOSIS — M9903 Segmental and somatic dysfunction of lumbar region: Secondary | ICD-10-CM | POA: Diagnosis not present

## 2021-08-28 DIAGNOSIS — M5441 Lumbago with sciatica, right side: Secondary | ICD-10-CM | POA: Diagnosis not present

## 2021-08-28 DIAGNOSIS — M9903 Segmental and somatic dysfunction of lumbar region: Secondary | ICD-10-CM | POA: Diagnosis not present

## 2021-08-28 DIAGNOSIS — M9905 Segmental and somatic dysfunction of pelvic region: Secondary | ICD-10-CM | POA: Diagnosis not present

## 2021-08-28 DIAGNOSIS — M9902 Segmental and somatic dysfunction of thoracic region: Secondary | ICD-10-CM | POA: Diagnosis not present

## 2021-09-06 DIAGNOSIS — M9903 Segmental and somatic dysfunction of lumbar region: Secondary | ICD-10-CM | POA: Diagnosis not present

## 2021-09-06 DIAGNOSIS — M9905 Segmental and somatic dysfunction of pelvic region: Secondary | ICD-10-CM | POA: Diagnosis not present

## 2021-09-06 DIAGNOSIS — M9902 Segmental and somatic dysfunction of thoracic region: Secondary | ICD-10-CM | POA: Diagnosis not present

## 2021-09-06 DIAGNOSIS — M5441 Lumbago with sciatica, right side: Secondary | ICD-10-CM | POA: Diagnosis not present

## 2021-09-18 ENCOUNTER — Other Ambulatory Visit: Payer: Self-pay

## 2021-09-18 ENCOUNTER — Ambulatory Visit
Admission: RE | Admit: 2021-09-18 | Discharge: 2021-09-18 | Disposition: A | Discharge status: Home / Self Care | Payer: Medicare PPO | Source: Ambulatory Visit | Attending: Internal Medicine | Admitting: Internal Medicine

## 2021-09-18 DIAGNOSIS — Z1231 Encounter for screening mammogram for malignant neoplasm of breast: Secondary | ICD-10-CM | POA: Diagnosis not present

## 2021-09-18 DIAGNOSIS — Z1211 Encounter for screening for malignant neoplasm of colon: Secondary | ICD-10-CM

## 2021-10-03 DIAGNOSIS — M1990 Unspecified osteoarthritis, unspecified site: Secondary | ICD-10-CM | POA: Diagnosis not present

## 2021-10-03 DIAGNOSIS — Z882 Allergy status to sulfonamides status: Secondary | ICD-10-CM | POA: Diagnosis not present

## 2021-10-03 DIAGNOSIS — R030 Elevated blood-pressure reading, without diagnosis of hypertension: Secondary | ICD-10-CM | POA: Diagnosis not present

## 2021-10-03 DIAGNOSIS — F419 Anxiety disorder, unspecified: Secondary | ICD-10-CM | POA: Diagnosis not present

## 2022-01-07 DIAGNOSIS — H52223 Regular astigmatism, bilateral: Secondary | ICD-10-CM | POA: Diagnosis not present

## 2022-01-07 DIAGNOSIS — H43393 Other vitreous opacities, bilateral: Secondary | ICD-10-CM | POA: Diagnosis not present

## 2022-03-12 ENCOUNTER — Encounter: Payer: Self-pay | Admitting: Internal Medicine

## 2022-03-19 ENCOUNTER — Encounter: Payer: Self-pay | Admitting: Internal Medicine

## 2022-03-19 ENCOUNTER — Telehealth (INDEPENDENT_AMBULATORY_CARE_PROVIDER_SITE_OTHER): Payer: Medicare PPO | Admitting: Internal Medicine

## 2022-03-19 DIAGNOSIS — E785 Hyperlipidemia, unspecified: Secondary | ICD-10-CM | POA: Diagnosis not present

## 2022-03-19 DIAGNOSIS — Z833 Family history of diabetes mellitus: Secondary | ICD-10-CM | POA: Diagnosis not present

## 2022-03-19 DIAGNOSIS — R410 Disorientation, unspecified: Secondary | ICD-10-CM | POA: Diagnosis not present

## 2022-03-19 DIAGNOSIS — Z79899 Other long term (current) drug therapy: Secondary | ICD-10-CM | POA: Diagnosis not present

## 2022-03-19 DIAGNOSIS — R412 Retrograde amnesia: Secondary | ICD-10-CM | POA: Diagnosis not present

## 2022-03-19 NOTE — Progress Notes (Signed)
?Virtual Visit via Video Note ? ?I connected with Heather Lamb on 03/19/22 at 12:30 PM EDT by a video enabled telemedicine application and verified that I am speaking with the correct person using two identifiers. ?Location patient: home ?Location provider:r home office ?Persons participating in the virtual visit: patient, provider ? ?[place] national recommendations  regarding COVID 19 pandemic   video visit is advised over in office visit for this patient.  ?Patient aware  of the limitations of evaluation and management by telemedicine and  availability of in person appointments. and agreed to proceed. ? ? ?HPI: ?Heather Lamb presents for video visit because of an episode that probably lasted 45 minutes or so on March 31 after going outside coffee only no breakfast.  She came into the house Asking and feeling like she had not done the taxes her husband Since she did but she saw the paperwork and did not remember doing them.  Her grandson came for tutoring but she did not remember and asked why he was there.  She could not remember the president.  Otherwise no motor difficulties speech difficulties pain falling shakiness.  She felt a bit better after drinking Coke.  Her husband was going to take her to the ER but she improved. ?See attached scanned notes that she wrote journaling what happens.  On MyChart message. ?She did not take any medicine that day her previous that was new has not been taking Ativan although on her med list for as needed. ?No chest pain shortness of breath syncope seizure activity.  And she has never had this happen before. ? ?Is a retired Runner, broadcasting/film/video.  ?ROS: See pertinent positives and negatives per HPI. ? ?Past Medical History:  ?Diagnosis Date  ? Allergy   ? Anxiety   ? Epiglottitis 11/13/2015  ? History of UTI   ? Kidney stones   ? Panic attacks 08/23/2014  ? Right lower lobe pneumonia 10/11/2011  ? Supraglottitis 11/13/2015  ? ? ?Past Surgical History:  ?Procedure Laterality Date  ? EYE  SURGERY  2019  ? cataract removal Feb and March  ? NO PAST SURGERIES    ? TUBAL LIGATION  1988  ? URETHRAL SLING  05/09/2019  ? ? ?Family History  ?Problem Relation Age of Onset  ? Arthritis Father   ? Heart disease Father   ? Hypertension Other   ?     parent  ? Stroke Other   ? Hyperlipidemia Other   ? ? ?Social History  ? ?Tobacco Use  ? Smoking status: Never  ? Smokeless tobacco: Never  ?Vaping Use  ? Vaping Use: Never used  ?Substance Use Topics  ? Alcohol use: No  ? Drug use: No  ? ? ? ? ?Current Outpatient Medications:  ?  calcium carbonate (OS-CAL) 600 MG TABS tablet, Take 600 mg by mouth 2 (two) times daily with a meal. With vit D3, Disp: , Rfl:  ?  Cholecalciferol (VITAMIN D PO), Take 1 tablet by mouth daily., Disp: , Rfl:  ?  LORazepam (ATIVAN) 0.5 MG tablet, Take 1 tablet (0.5 mg total) by mouth 2 (two) times daily as needed for anxiety., Disp: 24 tablet, Rfl: 0 ?  OVER THE COUNTER MEDICATION, Biosil hair and nail supplement, Disp: , Rfl:  ?  valACYclovir (VALTREX) 1000 MG tablet, TAKE 2 TABLETS(2000 MG) BY MOUTH TWICE DAILY FOR COLD SORES, Disp: 30 tablet, Rfl: 3 ? ?EXAM: ?BP Readings from Last 3 Encounters:  ?05/22/21 134/80  ?04/04/20 126/82  ?07/13/19  122/74  ? ? ?VITALS per patient if applicable: ? ?GENERAL: alert, oriented, appears well and in no acute distress ? ?HEENT: atraumatic, conjunttiva clear, no obvious abnormalities on inspection of external nose and ears ? ?NECK: normal movements of the head and neck ? ?LUNGS: on inspection no signs of respiratory distress, breathing rate appears normal, no obvious gross SOB, gasping or wheezing ? ?CV: no obvious cyanosis ? ?MS: moves all visible extremities without noticeable abnormality ? ?PSYCH/NEURO: pleasant and cooperative, no obvious depression or anxiety, speech and thought processing grossly intact ?Lab Results  ?Component Value Date  ? WBC 6.0 06/18/2021  ? HGB 14.4 06/18/2021  ? HCT 42.0 06/18/2021  ? PLT 280.0 06/18/2021  ? GLUCOSE 107 (H)  06/18/2021  ? CHOL 191 06/18/2021  ? TRIG 105.0 06/18/2021  ? HDL 38.20 (L) 06/18/2021  ? LDLCALC 131 (H) 06/18/2021  ? ALT 13 06/18/2021  ? AST 12 06/18/2021  ? NA 140 06/18/2021  ? K 4.4 06/18/2021  ? CL 102 06/18/2021  ? CREATININE 0.57 06/18/2021  ? BUN 14 06/18/2021  ? CO2 29 06/18/2021  ? TSH 1.44 06/18/2021  ? HGBA1C 6.2 06/18/2021  ? ? ?ASSESSMENT AND PLAN: ? ?Discussed the following assessment and plan: ? ?  ICD-10-CM   ?1. Episode of confusion  R41.0 MR Brain Wo Contrast  ?  Ambulatory referral to Neurology  ?  ?2. Amnesia (retrograde)  R41.2 Basic metabolic panel  ?  CBC with Differential/Platelet  ?  Hemoglobin A1c  ?  Hepatic function panel  ?  Lipid panel  ?  TSH  ?  T4, free  ?  MR Brain Wo Contrast  ?  Ambulatory referral to Neurology  ?  ?3. Family history of diabetes mellitus  Z83.3 Basic metabolic panel  ?  CBC with Differential/Platelet  ?  Hemoglobin A1c  ?  Hepatic function panel  ?  Lipid panel  ?  TSH  ?  T4, free  ?  ?4. Hyperlipidemia, unspecified hyperlipidemia type  E78.5 Basic metabolic panel  ?  CBC with Differential/Platelet  ?  Hemoglobin A1c  ?  Hepatic function panel  ?  Lipid panel  ?  TSH  ?  T4, free  ?  ?5. Medication management  Z79.899 Basic metabolic panel  ?  CBC with Differential/Platelet  ?  Hemoglobin A1c  ?  Hepatic function panel  ?  Lipid panel  ?  TSH  ?  T4, free  ?  ? ?Fortunately appears she is back to baseline but the episode was quite alarming. ?This could be TGA rule out other causes she thinks may be triggered by not having breakfast but just coffee but has had done that before on her weight watchers and had no symptoms. ?No evidence of focal neuro  events or cv sx . And on no meds that would cause hypoglycemia .  ?Counseled.  ?Get updated labs  ?Mri brain ordered and then plan neuro consult  ? Expectant management and discussion of plan and treatment with opportunity to ask questions and all were answered. The patient agreed with the plan and demonstrated an  understanding of the instructions. ? 45 minutes review visit  order and plan for evaluation  possible TGA vs other such as tia  less likely .  ?Advised to call back or seek an in-person evaluation if worsening  or having  further concerns  in interim. ?Return for if recurs  and  depending on results lab scan and consult. ? ? ? ?  Berniece Andreas, MD  ?

## 2022-04-09 ENCOUNTER — Ambulatory Visit (INDEPENDENT_AMBULATORY_CARE_PROVIDER_SITE_OTHER): Payer: Medicare PPO

## 2022-04-09 VITALS — Ht 66.0 in | Wt 192.0 lb

## 2022-04-09 DIAGNOSIS — Z0000 Encounter for general adult medical examination without abnormal findings: Secondary | ICD-10-CM

## 2022-04-09 NOTE — Progress Notes (Signed)
? ?Subjective:  ? Heather Lamb is a 76 y.o. female who presents for Medicare Annual (Subsequent) preventive examination. ? ?Review of Systems    ?Virtual Visit via Telephone Note ? ?I connected with  Heather Lamb on 04/09/22 at  9:15 AM EDT by telephone and verified that I am speaking with the correct person using two identifiers. ? ?Location: ?Patient: Home ?Provider: Office ?Persons participating in the virtual visit: patient/Nurse Health Advisor ?  ?I discussed the limitations, risks, security and privacy concerns of performing an evaluation and management service by telephone and the availability of in person appointments. The patient expressed understanding and agreed to proceed. ? ?Interactive audio and video telecommunications were attempted between this nurse and patient, however failed, due to patient having technical difficulties OR patient did not have access to video capability.  We continued and completed visit with audio only. ? ?Some vital signs may be absent or patient reported.  ? ?Heather RungBeverly W Shalaine Payson, LPN  ?  ? ?   ?Objective:  ?  ?There were no vitals filed for this visit. ?There is no height or weight on file to calculate BMI. ? ? ?  04/08/2021  ? 10:03 AM 01/26/2019  ?  4:20 PM 11/13/2015  ?  5:45 AM 11/13/2015  ? 12:37 AM  ?Advanced Directives  ?Does Patient Have a Medical Advance Directive? No;Yes Yes Yes No  ?Type of Estate agentAdvance Directive Healthcare Power of [place];Living will Healthcare Power of [place];Living will Healthcare Power of [place];Living will   ?Does patient want to make changes to medical advance directive?  No - Patient declined No - Patient declined   ?Copy of Healthcare Power of Attorney in Chart? Yes - validated most recent copy scanned in chart (See row information) Yes - validated most recent copy scanned in chart (See row information) Yes   ?Would patient like information on creating a medical advance directive? No - Patient declined     ? ? ?Current Medications  (verified) ?Outpatient Encounter Medications as of 04/09/2022  ?Medication Sig  ? calcium carbonate (OS-CAL) 600 MG TABS tablet Take 600 mg by mouth 2 (two) times daily with a meal. With vit D3  ? Cholecalciferol (VITAMIN D PO) Take 1 tablet by mouth daily.  ? LORazepam (ATIVAN) 0.5 MG tablet Take 1 tablet (0.5 mg total) by mouth 2 (two) times daily as needed for anxiety.  ? OVER THE COUNTER MEDICATION Biosil hair and nail supplement  ? valACYclovir (VALTREX) 1000 MG tablet TAKE 2 TABLETS(2000 MG) BY MOUTH TWICE DAILY FOR COLD SORES  ? ?No facility-administered encounter medications on file as of 04/09/2022.  ? ? ?Allergies (verified) ?Sulfa antibiotics  ? ?History: ?Past Medical History:  ?Diagnosis Date  ? Allergy   ? Anxiety   ? Epiglottitis 11/13/2015  ? History of UTI   ? Kidney stones   ? Panic attacks 08/23/2014  ? Right lower lobe pneumonia 10/11/2011  ? Supraglottitis 11/13/2015  ? ?Past Surgical History:  ?Procedure Laterality Date  ? EYE SURGERY  2019  ? cataract removal Feb and March  ? NO PAST SURGERIES    ? TUBAL LIGATION  1988  ? URETHRAL SLING  05/09/2019  ? ?Family History  ?Problem Relation Age of Onset  ? Arthritis Father   ? Heart disease Father   ? Hypertension Other   ?     parent  ? Stroke Other   ? Hyperlipidemia Other   ? ?Social History  ? ?Socioeconomic History  ? Marital status: Married  ?  Spouse name: Not on file  ? Number of children: 4  ? Years of education: Not on file  ? Highest education level: Not on file  ?Occupational History  ? Occupation: Editor, commissioning  ?  Comment: retired  ?Tobacco Use  ? Smoking status: Never  ? Smokeless tobacco: Never  ?Vaping Use  ? Vaping Use: Never used  ?Substance and Sexual Activity  ? Alcohol use: No  ? Drug use: No  ? Sexual activity: Not on file  ?Other Topics Concern  ? Not on file  ?Social History Narrative  ? HHof 2   ? Retired Teacher, early years/pre   ? BS degree  ? g4 p4   ? Neg tad husband smokes but no ets.  Now  stopped   ?   ? 01/26/19: Lives with husband in  2 story house  ? Has two daughters, two sons, all of whom live locally with grandkids. Active in their care.  ? Exercises at [facility] doing elliptical, bike, weight training    Before covid active after but not yet as much   ?   ? ?Social Determinants of Health  ? ?Financial Resource Strain: Not on file  ?Food Insecurity: Not on file  ?Transportation Needs: Not on file  ?Physical Activity: Not on file  ?Stress: Not on file  ?Social Connections: Not on file  ? ? ?Tobacco Counseling ?Counseling given: Not Answered ? ? ?Clinical Intake: ? ?Diabetic?  No ? ? ?Activities of Daily Living ?   ? View : No data to display.  ?  ?  ?  ? ? ?Patient Care Team: ?Panosh, Neta Mends, MD as PCP - General (Internal Medicine) ?Nita Sells, MD (Dermatology) ?Serena Colonel, MD as Consulting Physician (Otolaryngology) ?Daisy Lazar, DO (Optometry) ? ?Indicate any recent Medical Services you may have received from other than [facility] providers in the past year (date may be approximate). ? ?   ?Assessment:  ? This is a routine wellness examination for Heather Lamb. ? ?Hearing/Vision screen ?No results found. ? ?Dietary issues and exercise activities discussed: ?  ? ? Goals Addressed   ?None ?  ? ?Depression Screen ? ?  04/08/2021  ? 10:04 AM 04/04/2020  ?  9:17 AM 07/13/2019  ?  9:57 AM 01/26/2019  ?  4:21 PM 09/23/2017  ?  2:45 PM 04/30/2016  ?  2:40 PM 04/24/2015  ?  2:52 PM  ?PHQ 2/9 Scores  ?PHQ - 2 Score 0 0 0 0 0 0 0  ?PHQ- 9 Score  0  0     ?  ?Fall Risk ? ?  04/08/2021  ? 10:04 AM 04/04/2020  ?  9:18 AM 07/13/2019  ?  9:57 AM 01/26/2019  ?  4:21 PM 09/23/2017  ?  2:45 PM  ?Fall Risk   ?Falls in the past year? 0 0 0 0 No  ?Number falls in past yr: 0  0    ?Injury with Fall? 0  0    ? ? ?FALL RISK PREVENTION PERTAINING TO THE HOME: ? ?Any stairs in or around the home? Yes  ?If so, are there any without handrails? No  ?Home free of loose throw rugs in walkways, pet beds, electrical cords, etc? Yes  ?Adequate lighting in your home to reduce risk of falls? Yes   ? ?ASSISTIVE DEVICES UTILIZED TO PREVENT FALLS: ? ?Life alert? No  ?Use of a cane, walker or w/c? No  ?Grab bars in the bathroom? Yes  ?Shower chair or bench in  shower? Yes  ?Elevated toilet seat or a handicapped toilet? Yes  ? ?TIMED UP AND GO: ? ?Was the test performed? No . Audio Visit ? ?Cognitive Function: ?  ?  ?  ? ?Immunizations ?Immunization History  ?Administered Date(s) Administered  ? Pneumococcal Conjugate-13 03/08/2014  ? Pneumococcal Polysaccharide-23 04/24/2015  ? Td 09/23/2017  ? Zoster Recombinat (Shingrix) 10/01/2017, 10/31/2017, 12/07/2017  ? Zoster, Live 10/06/2012  ? ? ?TDAP status: Up to date ? ?Flu Vaccine status: Declined, Education has been provided regarding the importance of this vaccine but patient still declined. Advised may receive this vaccine at local pharmacy or Health Dept. Aware to provide a copy of the vaccination record if obtained from local pharmacy or Health Dept. Verbalized acceptance and understanding. ? ?Pneumococcal vaccine status: Completed during today's visit. ? ?Covid-19 vaccine status: Declined, Education has been provided regarding the importance of this vaccine but patient still declined. Advised may receive this vaccine at local pharmacy or Health Dept.or vaccine clinic. Aware to provide a copy of the vaccination record if obtained from local pharmacy or Health Dept. Verbalized acceptance and understanding. ? ?Qualifies for Shingles Vaccine? Yes   ?Zostavax completed Yes   ?Shingrix Completed?: Yes ? ?Screening Tests ?Health Maintenance  ?Topic Date Due  ? COVID-19 Vaccine (1) Never done  ? COLON CANCER SCREENING ANNUAL FOBT  05/16/2022  ? INFLUENZA VACCINE  07/08/2022  ? MAMMOGRAM  09/18/2022  ? TETANUS/TDAP  09/24/2027  ? Pneumonia Vaccine 40+ Years old  Completed  ? DEXA SCAN  Completed  ? Hepatitis C Screening  Completed  ? Zoster Vaccines- Shingrix  Completed  ? HPV VACCINES  Aged Out  ? COLONOSCOPY (Pts 45-20yrs Insurance coverage will need to be confirmed)   Discontinued  ? Fecal DNA (Cologuard)  Discontinued  ? ? ?Health Maintenance ? ?Health Maintenance Due  ?Topic Date Due  ? COVID-19 Vaccine (1) Never done  ? ? ?Colorectal cancer screening: Type of screening: [place]

## 2022-04-09 NOTE — Patient Instructions (Addendum)
?Ms. Byington , ?Thank you for taking time to come for your Medicare Wellness Visit. I appreciate your ongoing commitment to your health goals. Please review the following plan we discussed and let me know if I can assist you in the future.  ? ?These are the goals we discussed: ? Goals   ? ?   Patient Stated (pt-stated)   ?   Continue to Lose more weight to get to goal of 175lbs. By next year. ?  ? ?  ?  ?This is a list of the screening recommended for you and due dates:  ?Health Maintenance  ?Topic Date Due  ? COVID-19 Vaccine (1) 04/25/2022*  ? Stool Blood Test  05/16/2022  ? Flu Shot  07/08/2022  ? Mammogram  09/18/2022  ? Tetanus Vaccine  09/24/2027  ? Pneumonia Vaccine  Completed  ? DEXA scan (bone density measurement)  Completed  ? Hepatitis C Screening: USPSTF Recommendation to screen - Ages 16-79 yo.  Completed  ? Zoster (Shingles) Vaccine  Completed  ? HPV Vaccine  Aged Out  ? Colon Cancer Screening  Discontinued  ? Cologuard (Stool DNA test)  Discontinued  ?*Topic was postponed. The date shown is not the original due date.  ? ?Advanced directives: Yes Copies on file ? ?Conditions/risks identified: None ? ?Next appointment: Follow up in one year for your annual wellness visit  ? ? ?Preventive Care 41 Years and Older, Female ?Preventive care refers to lifestyle choices and visits with your health care provider that can promote health and wellness. ?What does preventive care include? ?A yearly physical exam. This is also called an annual well check. ?Dental exams once or twice a year. ?Routine eye exams. Ask your health care provider how often you should have your eyes checked. ?Personal lifestyle choices, including: ?Daily care of your teeth and gums. ?Regular physical activity. ?Eating a healthy diet. ?Avoiding tobacco and drug use. ?Limiting alcohol use. ?Practicing safe sex. ?Taking low-dose aspirin every day. ?Taking vitamin and mineral supplements as recommended by your health care provider. ?What  happens during an annual well check? ?The services and screenings done by your health care provider during your annual well check will depend on your age, overall health, lifestyle risk factors, and family history of disease. ?Counseling  ?Your health care provider may ask you questions about your: ?Alcohol use. ?Tobacco use. ?Drug use. ?Emotional well-being. ?Home and relationship well-being. ?Sexual activity. ?Eating habits. ?History of falls. ?Memory and ability to understand (cognition). ?Work and work Astronomer. ?Reproductive health. ?Screening  ?You may have the following tests or measurements: ?Height, weight, and BMI. ?Blood pressure. ?Lipid and cholesterol levels. These may be checked every 5 years, or more frequently if you are over 42 years old. ?Skin check. ?Lung cancer screening. You may have this screening every year starting at age 67 if you have a 30-pack-year history of smoking and currently smoke or have quit within the past 15 years. ?Fecal occult blood test (FOBT) of the stool. You may have this test every year starting at age 9. ?Flexible sigmoidoscopy or colonoscopy. You may have a sigmoidoscopy every 5 years or a colonoscopy every 10 years starting at age 107. ?Hepatitis C blood test. ?Hepatitis B blood test. ?Sexually transmitted disease (STD) testing. ?Diabetes screening. This is done by checking your blood sugar (glucose) after you have not eaten for a while (fasting). You may have this done every 1-3 years. ?Bone density scan. This is done to screen for osteoporosis. You may have this done  starting at age 85. ?Mammogram. This may be done every 1-2 years. Talk to your health care provider about how often you should have regular mammograms. ?Talk with your health care provider about your test results, treatment options, and if necessary, the need for more tests. ?Vaccines  ?Your health care provider may recommend certain vaccines, such as: ?Influenza vaccine. This is recommended every  year. ?Tetanus, diphtheria, and acellular pertussis (Tdap, Td) vaccine. You may need a Td booster every 10 years. ?Zoster vaccine. You may need this after age 105. ?Pneumococcal 13-valent conjugate (PCV13) vaccine. One dose is recommended after age 40. ?Pneumococcal polysaccharide (PPSV23) vaccine. One dose is recommended after age 31. ?Talk to your health care provider about which screenings and vaccines you need and how often you need them. ?This information is not intended to replace advice given to you by your health care provider. Make sure you discuss any questions you have with your health care provider. ?Document Released: 12/21/2015 Document Revised: 08/13/2016 Document Reviewed: 09/25/2015 ?Elsevier Interactive Patient Education ? 2017 [place]. ? ?Fall Prevention in the Home ?Falls can cause injuries. They can happen to people of all ages. There are many things you can do to make your home safe and to help prevent falls. ?What can I do on the outside of my home? ?Regularly fix the edges of walkways and driveways and fix any cracks. ?Remove anything that might make you trip as you walk through a door, such as a raised step or threshold. ?Trim any bushes or trees on the path to your home. ?Use bright outdoor lighting. ?Clear any walking paths of anything that might make someone trip, such as rocks or tools. ?Regularly check to see if handrails are loose or broken. Make sure that both sides of any steps have handrails. ?Any raised decks and porches should have guardrails on the edges. ?Have any leaves, snow, or ice cleared regularly. ?Use sand or salt on walking paths during winter. ?Clean up any spills in your garage right away. This includes oil or grease spills. ?What can I do in the bathroom? ?Use night lights. ?Install grab bars by the toilet and in the tub and shower. Do not use towel bars as grab bars. ?Use non-skid mats or decals in the tub or shower. ?If you need to sit down in the shower, use a  plastic, non-slip stool. ?Keep the floor dry. Clean up any water that spills on the floor as soon as it happens. ?Remove soap buildup in the tub or shower regularly. ?Attach bath mats securely with double-sided non-slip rug tape. ?Do not have throw rugs and other things on the floor that can make you trip. ?What can I do in the bedroom? ?Use night lights. ?Make sure that you have a light by your bed that is easy to reach. ?Do not use any sheets or blankets that are too big for your bed. They should not hang down onto the floor. ?Have a firm chair that has side arms. You can use this for support while you get dressed. ?Do not have throw rugs and other things on the floor that can make you trip. ?What can I do in the kitchen? ?Clean up any spills right away. ?Avoid walking on wet floors. ?Keep items that you use a lot in easy-to-reach places. ?If you need to reach something above you, use a strong step stool that has a grab bar. ?Keep electrical cords out of the way. ?Do not use floor polish or wax that  makes floors slippery. If you must use wax, use non-skid floor wax. ?Do not have throw rugs and other things on the floor that can make you trip. ?What can I do with my stairs? ?Do not leave any items on the stairs. ?Make sure that there are handrails on both sides of the stairs and use them. Fix handrails that are broken or loose. Make sure that handrails are as long as the stairways. ?Check any carpeting to make sure that it is firmly attached to the stairs. Fix any carpet that is loose or worn. ?Avoid having throw rugs at the top or bottom of the stairs. If you do have throw rugs, attach them to the floor with carpet tape. ?Make sure that you have a light switch at the top of the stairs and the bottom of the stairs. If you do not have them, ask someone to add them for you. ?What else can I do to help prevent falls? ?Wear shoes that: ?Do not have high heels. ?Have rubber bottoms. ?Are comfortable and fit you  well. ?Are closed at the toe. Do not wear sandals. ?If you use a stepladder: ?Make sure that it is fully opened. Do not climb a closed stepladder. ?Make sure that both sides of the stepladder are locked into place. ?As

## 2022-04-24 ENCOUNTER — Encounter: Payer: Self-pay | Admitting: Internal Medicine

## 2022-04-27 ENCOUNTER — Encounter: Payer: Self-pay | Admitting: Internal Medicine

## 2022-04-28 NOTE — Telephone Encounter (Signed)
Please data enter  into  record  FIT  test.

## 2022-04-28 NOTE — Telephone Encounter (Signed)
Sent to Scan

## 2022-06-27 ENCOUNTER — Telehealth: Payer: Self-pay | Admitting: Internal Medicine

## 2022-06-27 NOTE — Telephone Encounter (Signed)
c/o anxiety symptoms since may. Requesting a prescription for LORazepam (ATIVAN) 0.5 MG tablet   [id number] - [place], [place] - [address] AT [address] Phone:  [phone]  Fax:  [phone]

## 2022-06-27 NOTE — Telephone Encounter (Signed)
Last refill per controlled substance database: 05/23/21 Last OV: 03/19/22 (VV for acute); last CPE 05/22/21 Next OV: none scheduled

## 2022-06-29 ENCOUNTER — Other Ambulatory Visit: Payer: Self-pay | Admitting: Internal Medicine

## 2022-06-30 MED ORDER — LORAZEPAM 0.5 MG PO TABS: 0.5000 mg | ORAL_TABLET | Freq: Two times a day (BID) | ORAL | 0 refills | Status: AC | PRN

## 2022-06-30 NOTE — Telephone Encounter (Signed)
Last Vv 03/19/22 Filled 05/22/21 Is it ok to refill?

## 2022-08-05 ENCOUNTER — Other Ambulatory Visit: Payer: Self-pay | Admitting: Internal Medicine

## 2022-08-05 DIAGNOSIS — Z1231 Encounter for screening mammogram for malignant neoplasm of breast: Secondary | ICD-10-CM

## 2022-08-20 ENCOUNTER — Encounter: Payer: Self-pay | Admitting: Internal Medicine

## 2022-08-20 NOTE — Telephone Encounter (Signed)
LM for patient to call and schedule cpe

## 2022-09-18 DIAGNOSIS — Z131 Encounter for screening for diabetes mellitus: Secondary | ICD-10-CM | POA: Diagnosis not present

## 2022-09-18 DIAGNOSIS — Z1321 Encounter for screening for nutritional disorder: Secondary | ICD-10-CM | POA: Diagnosis not present

## 2022-09-18 DIAGNOSIS — E782 Mixed hyperlipidemia: Secondary | ICD-10-CM | POA: Diagnosis not present

## 2022-09-18 DIAGNOSIS — Z1329 Encounter for screening for other suspected endocrine disorder: Secondary | ICD-10-CM | POA: Diagnosis not present

## 2022-09-18 DIAGNOSIS — Z13220 Encounter for screening for lipoid disorders: Secondary | ICD-10-CM | POA: Diagnosis not present

## 2022-09-18 DIAGNOSIS — E041 Nontoxic single thyroid nodule: Secondary | ICD-10-CM | POA: Diagnosis not present

## 2022-09-18 DIAGNOSIS — Z130 Encounter for screening for diseases of the blood and blood-forming organs and certain disorders involving the immune mechanism: Secondary | ICD-10-CM | POA: Diagnosis not present

## 2022-09-18 DIAGNOSIS — E559 Vitamin D deficiency, unspecified: Secondary | ICD-10-CM | POA: Diagnosis not present

## 2022-09-18 DIAGNOSIS — Z0000 Encounter for general adult medical examination without abnormal findings: Secondary | ICD-10-CM | POA: Diagnosis not present

## 2022-09-23 ENCOUNTER — Ambulatory Visit
Admission: RE | Admit: 2022-09-23 | Discharge: 2022-09-23 | Disposition: A | Discharge status: Home / Self Care | Payer: Medicare PPO | Source: Ambulatory Visit | Attending: Internal Medicine | Admitting: Internal Medicine

## 2022-09-23 DIAGNOSIS — Z1231 Encounter for screening mammogram for malignant neoplasm of breast: Secondary | ICD-10-CM | POA: Diagnosis not present

## 2022-09-29 DIAGNOSIS — Z1211 Encounter for screening for malignant neoplasm of colon: Secondary | ICD-10-CM | POA: Diagnosis not present

## 2022-10-04 LAB — COLOGUARD: COLOGUARD: NEGATIVE

## 2022-11-27 IMAGING — MG MM DIGITAL SCREENING BILAT W/ TOMO AND CAD
8 series · 8 of 24 positions shown · non-contrast
Comparison: Previous exam(s).

CLINICAL DATA: Screening.

EXAM:
DIGITAL SCREENING BILATERAL MAMMOGRAM WITH TOMOSYNTHESIS AND CAD
TECHNIQUE: Bilateral screening digital craniocaudal and mediolateral oblique
mammograms were obtained. Bilateral screening digital breast
tomosynthesis was performed. The images were evaluated with
computer-aided detection.

[L CC synth-2D]
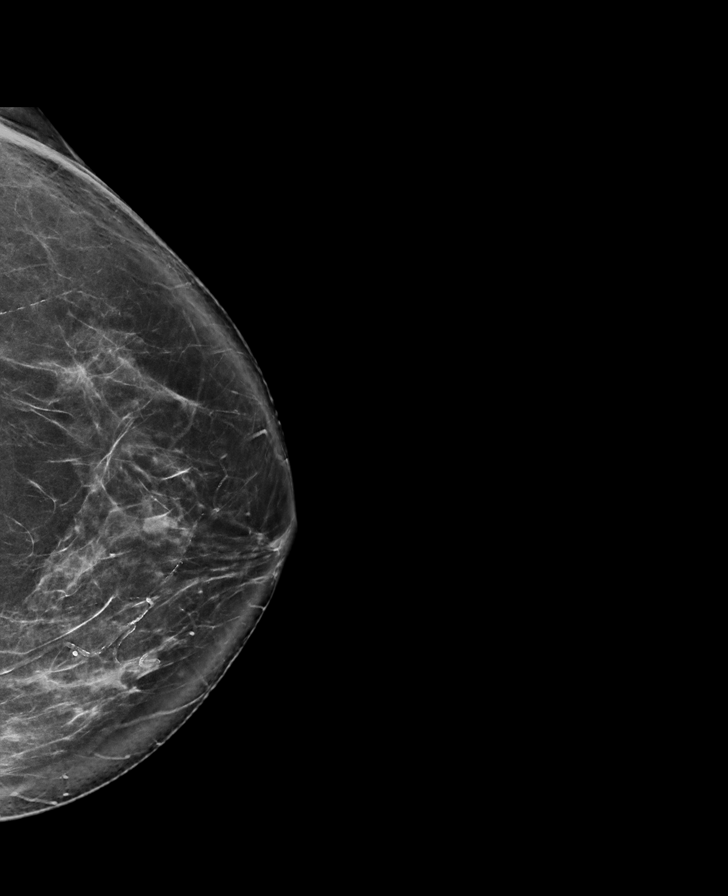

[R MLO synth-2D]
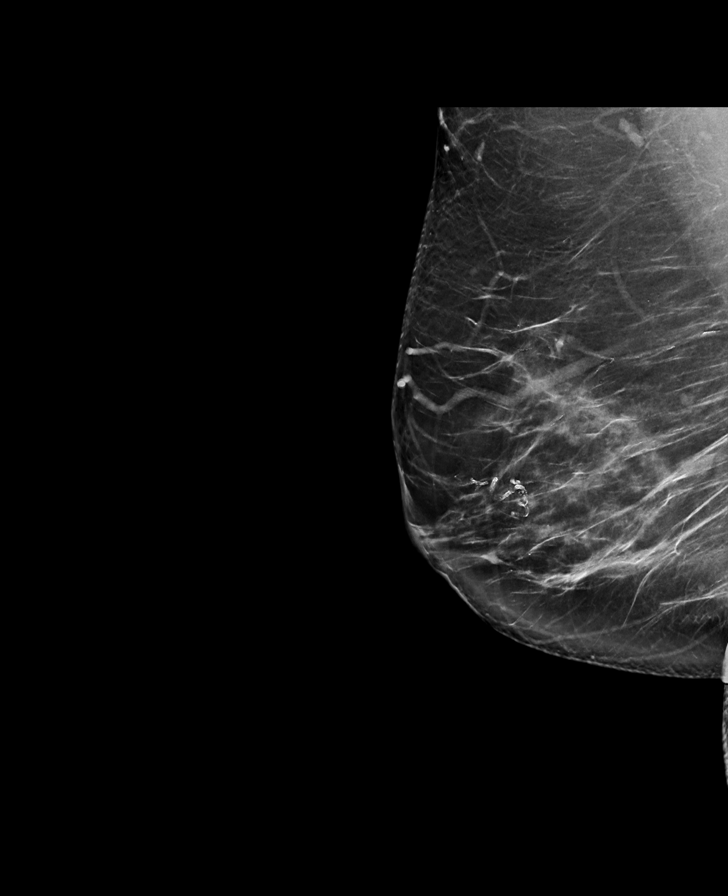

[R CC synth-2D]
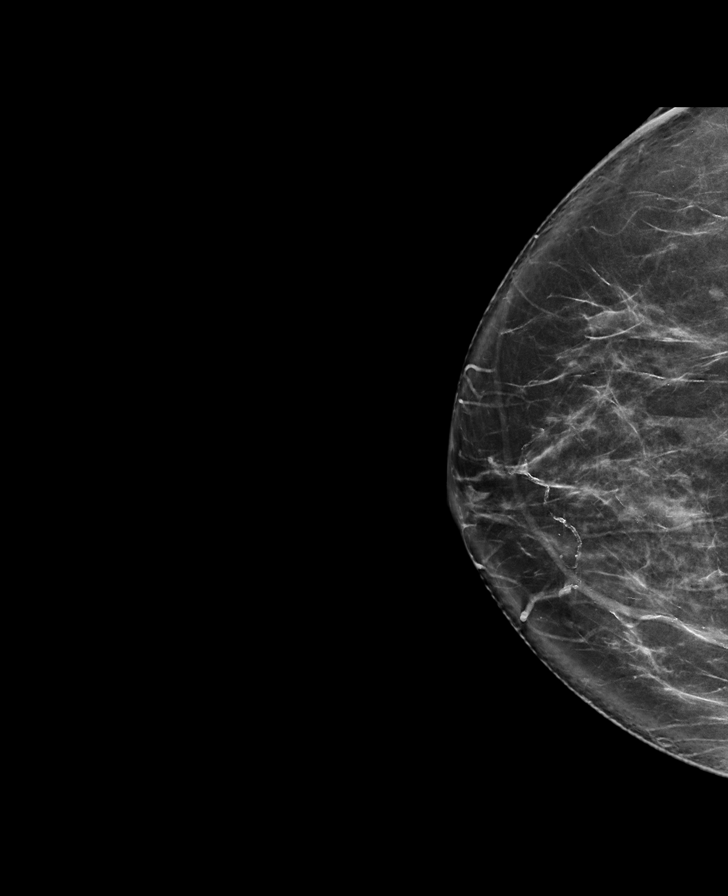

[L MLO synth-2D]
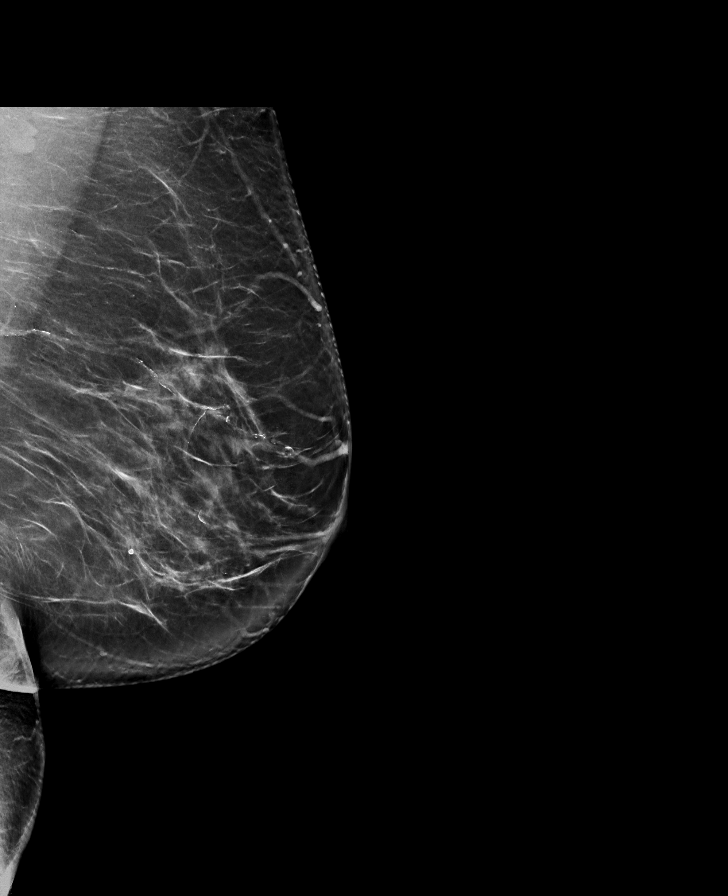

[L CC tomo · tomo slice 47/94.0]
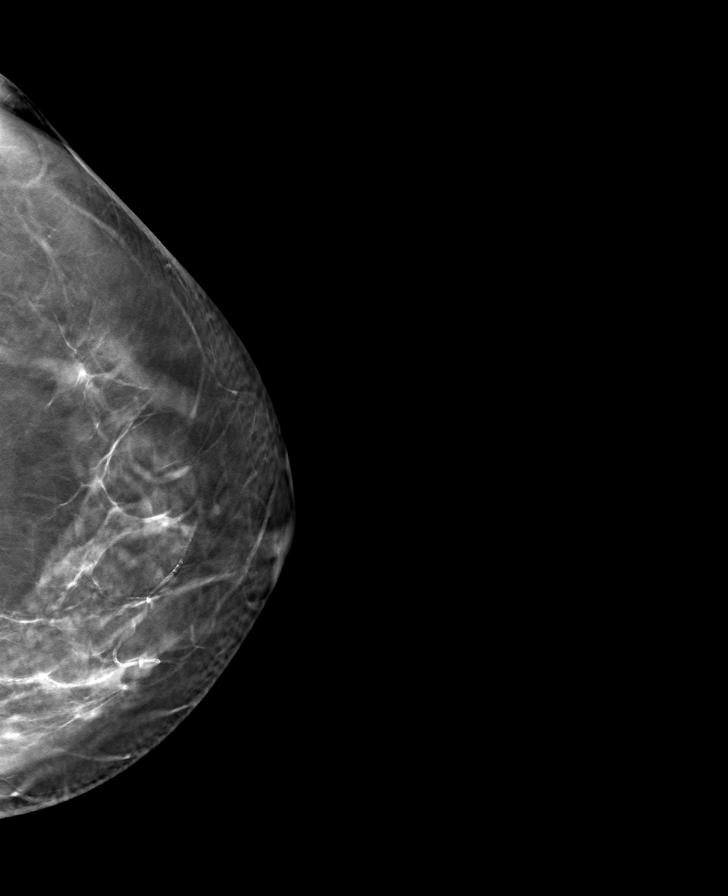

[L MLO tomo · tomo slice 46/91.0]
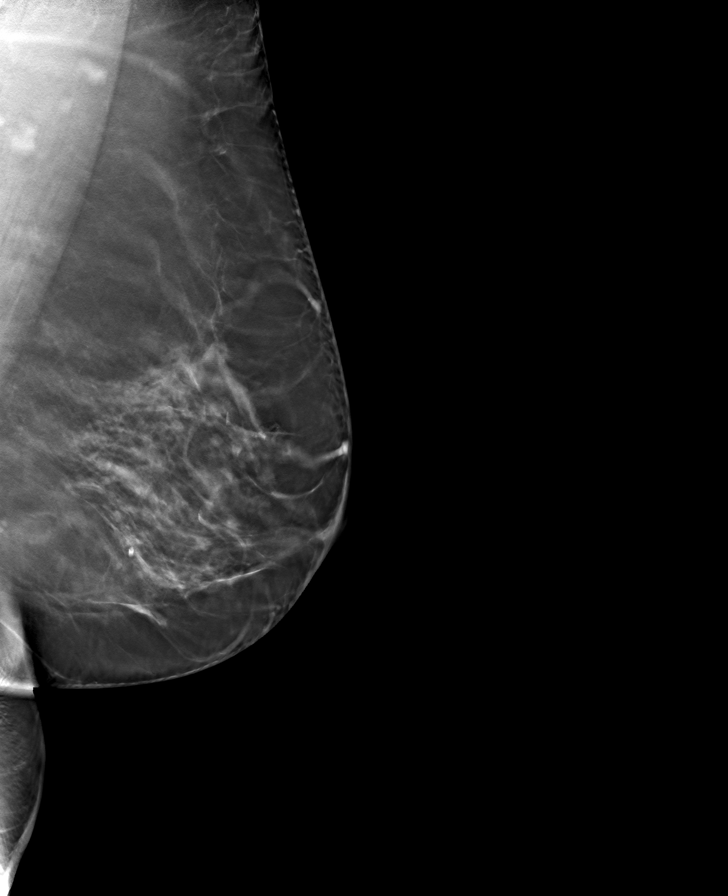

[R CC tomo · tomo slice 41/82.0]
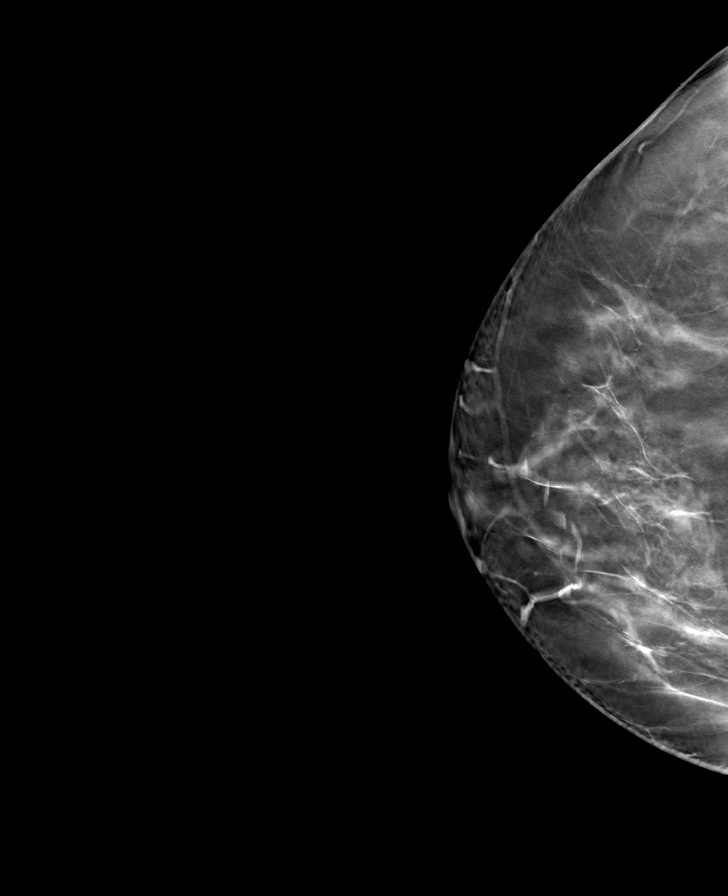

[R MLO tomo · tomo slice 46/91.0]
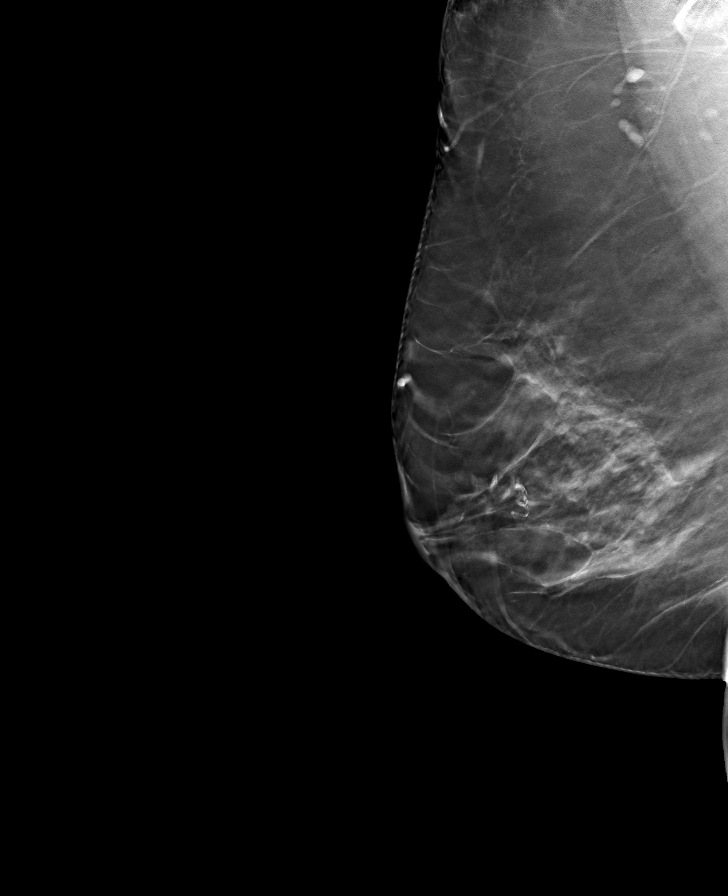

[8 of 24 positions shown; findings below may reference images not displayed]

ACR Breast Density Category c: The breast tissue is heterogeneously
dense, which may obscure small masses.
FINDINGS: There are no findings suspicious for malignancy.
IMPRESSION: No mammographic evidence of malignancy. A result letter of this
screening mammogram will be mailed directly to the patient.

RECOMMENDATION:
Screening mammogram in one year. (Code:[id number]-W-BC3)

BI-RADS CATEGORY  1: Negative.

## 2022-12-29 DIAGNOSIS — F419 Anxiety disorder, unspecified: Secondary | ICD-10-CM | POA: Diagnosis not present

## 2022-12-29 DIAGNOSIS — E782 Mixed hyperlipidemia: Secondary | ICD-10-CM | POA: Diagnosis not present

## 2022-12-29 DIAGNOSIS — E559 Vitamin D deficiency, unspecified: Secondary | ICD-10-CM | POA: Diagnosis not present

## 2022-12-29 DIAGNOSIS — R7309 Other abnormal glucose: Secondary | ICD-10-CM | POA: Diagnosis not present

## 2022-12-29 DIAGNOSIS — B001 Herpesviral vesicular dermatitis: Secondary | ICD-10-CM | POA: Diagnosis not present

## 2023-01-08 DIAGNOSIS — H43813 Vitreous degeneration, bilateral: Secondary | ICD-10-CM | POA: Diagnosis not present

## 2023-03-24 DIAGNOSIS — E559 Vitamin D deficiency, unspecified: Secondary | ICD-10-CM | POA: Diagnosis not present

## 2023-03-24 DIAGNOSIS — M79604 Pain in right leg: Secondary | ICD-10-CM | POA: Diagnosis not present

## 2023-03-24 DIAGNOSIS — E782 Mixed hyperlipidemia: Secondary | ICD-10-CM | POA: Diagnosis not present

## 2023-03-24 DIAGNOSIS — L658 Other specified nonscarring hair loss: Secondary | ICD-10-CM | POA: Diagnosis not present

## 2023-03-24 DIAGNOSIS — E663 Overweight: Secondary | ICD-10-CM | POA: Diagnosis not present

## 2023-03-24 DIAGNOSIS — Z6827 Body mass index (BMI) 27.0-27.9, adult: Secondary | ICD-10-CM | POA: Diagnosis not present

## 2023-03-24 DIAGNOSIS — M79605 Pain in left leg: Secondary | ICD-10-CM | POA: Diagnosis not present

## 2023-05-07 DIAGNOSIS — R7309 Other abnormal glucose: Secondary | ICD-10-CM | POA: Diagnosis not present

## 2023-05-07 DIAGNOSIS — E559 Vitamin D deficiency, unspecified: Secondary | ICD-10-CM | POA: Diagnosis not present

## 2023-05-07 DIAGNOSIS — Z0000 Encounter for general adult medical examination without abnormal findings: Secondary | ICD-10-CM | POA: Diagnosis not present

## 2023-05-07 DIAGNOSIS — B001 Herpesviral vesicular dermatitis: Secondary | ICD-10-CM | POA: Diagnosis not present

## 2023-05-07 DIAGNOSIS — E663 Overweight: Secondary | ICD-10-CM | POA: Diagnosis not present

## 2023-05-07 DIAGNOSIS — L658 Other specified nonscarring hair loss: Secondary | ICD-10-CM | POA: Diagnosis not present

## 2023-05-07 DIAGNOSIS — E782 Mixed hyperlipidemia: Secondary | ICD-10-CM | POA: Diagnosis not present

## 2023-05-07 DIAGNOSIS — E041 Nontoxic single thyroid nodule: Secondary | ICD-10-CM | POA: Diagnosis not present

## 2023-05-07 DIAGNOSIS — F419 Anxiety disorder, unspecified: Secondary | ICD-10-CM | POA: Diagnosis not present

## 2023-05-07 DIAGNOSIS — Z6827 Body mass index (BMI) 27.0-27.9, adult: Secondary | ICD-10-CM | POA: Diagnosis not present

## 2023-08-25 ENCOUNTER — Other Ambulatory Visit: Payer: Self-pay | Admitting: Physician Assistant

## 2023-08-25 DIAGNOSIS — Z1231 Encounter for screening mammogram for malignant neoplasm of breast: Secondary | ICD-10-CM

## 2023-09-24 ENCOUNTER — Other Ambulatory Visit: Payer: Self-pay | Admitting: Family Medicine

## 2023-09-24 DIAGNOSIS — Z1231 Encounter for screening mammogram for malignant neoplasm of breast: Secondary | ICD-10-CM

## 2023-09-30 ENCOUNTER — Ambulatory Visit
Admission: RE | Admit: 2023-09-30 | Discharge: 2023-09-30 | Disposition: A | Discharge status: Home / Self Care | Payer: Medicare PPO | Source: Ambulatory Visit | Attending: Physician Assistant | Admitting: Physician Assistant

## 2023-09-30 DIAGNOSIS — Z1231 Encounter for screening mammogram for malignant neoplasm of breast: Secondary | ICD-10-CM

## 2024-08-17 ENCOUNTER — Other Ambulatory Visit: Payer: Self-pay | Admitting: Family Medicine

## 2024-08-17 DIAGNOSIS — Z1231 Encounter for screening mammogram for malignant neoplasm of breast: Secondary | ICD-10-CM

## 2024-10-19 ENCOUNTER — Ambulatory Visit

## 2024-11-15 ENCOUNTER — Ambulatory Visit
Admission: RE | Admit: 2024-11-15 | Discharge: 2024-11-15 | Disposition: A | Source: Ambulatory Visit | Attending: Family Medicine | Admitting: Family Medicine

## 2024-11-15 DIAGNOSIS — Z1231 Encounter for screening mammogram for malignant neoplasm of breast: Secondary | ICD-10-CM

## 2025-01-02 ENCOUNTER — Other Ambulatory Visit (HOSPITAL_BASED_OUTPATIENT_CLINIC_OR_DEPARTMENT_OTHER): Payer: Self-pay | Admitting: Adult Health Nurse Practitioner

## 2025-01-02 DIAGNOSIS — E2839 Other primary ovarian failure: Secondary | ICD-10-CM
